# Patient Record
Sex: Female | Born: 1940 | Race: Black or African American | Hispanic: No | Smoking: Former smoker
Health system: Southern US, Community
[De-identification: ages and names within clinical notes are randomized; demographics above are authoritative.]

## PROBLEM LIST (undated history)

## (undated) DIAGNOSIS — K219 Gastro-esophageal reflux disease without esophagitis: Secondary | ICD-10-CM

## (undated) DIAGNOSIS — K5790 Diverticulosis of intestine, part unspecified, without perforation or abscess without bleeding: Secondary | ICD-10-CM

## (undated) DIAGNOSIS — Z8719 Personal history of other diseases of the digestive system: Secondary | ICD-10-CM

## (undated) DIAGNOSIS — K5792 Diverticulitis of intestine, part unspecified, without perforation or abscess without bleeding: Secondary | ICD-10-CM

## (undated) DIAGNOSIS — M810 Age-related osteoporosis without current pathological fracture: Secondary | ICD-10-CM

## (undated) DIAGNOSIS — M67919 Unspecified disorder of synovium and tendon, unspecified shoulder: Secondary | ICD-10-CM

## (undated) DIAGNOSIS — E7800 Pure hypercholesterolemia, unspecified: Secondary | ICD-10-CM

## (undated) DIAGNOSIS — E118 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: Secondary | ICD-10-CM

## (undated) DIAGNOSIS — I059 Rheumatic mitral valve disease, unspecified: Secondary | ICD-10-CM

## (undated) DIAGNOSIS — I10 Essential (primary) hypertension: Secondary | ICD-10-CM

## (undated) DIAGNOSIS — R011 Cardiac murmur, unspecified: Secondary | ICD-10-CM

## (undated) DIAGNOSIS — D649 Anemia, unspecified: Secondary | ICD-10-CM

## (undated) DIAGNOSIS — M1990 Unspecified osteoarthritis, unspecified site: Secondary | ICD-10-CM

## (undated) DIAGNOSIS — M719 Bursopathy, unspecified: Secondary | ICD-10-CM

## (undated) DIAGNOSIS — N200 Calculus of kidney: Secondary | ICD-10-CM

## (undated) DIAGNOSIS — E119 Type 2 diabetes mellitus without complications: Secondary | ICD-10-CM

## (undated) HISTORY — DX: Diverticulosis of intestine, part unspecified, without perforation or abscess without bleeding: K57.90

## (undated) HISTORY — DX: Rheumatic mitral valve disease, unspecified: I05.9

## (undated) HISTORY — DX: Personal history of other diseases of the digestive system: Z87.19

## (undated) HISTORY — DX: Unspecified disorder of synovium and tendon, unspecified shoulder: M67.919

## (undated) HISTORY — DX: Anemia, unspecified: D64.9

## (undated) HISTORY — DX: Pure hypercholesterolemia, unspecified: E78.00

## (undated) HISTORY — PX: ABDOMINAL HYSTERECTOMY: SHX81

## (undated) HISTORY — DX: 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: E11.8

## (undated) HISTORY — PX: PARTIAL HYSTERECTOMY: SHX80

## (undated) HISTORY — DX: Essential (primary) hypertension: I10

## (undated) HISTORY — DX: Gastro-esophageal reflux disease without esophagitis: K21.9

## (undated) HISTORY — DX: Age-related osteoporosis without current pathological fracture: M81.0

## (undated) HISTORY — DX: Bursopathy, unspecified: M71.9

## (undated) HISTORY — PX: OTHER SURGICAL HISTORY: SHX169

## (undated) HISTORY — PX: TOTAL VAGINAL HYSTERECTOMY: SHX2548

---

## 1968-02-19 HISTORY — PX: BREAST EXCISIONAL BIOPSY: SUR124

## 2004-01-18 ENCOUNTER — Ambulatory Visit: Payer: Self-pay | Admitting: Family Medicine

## 2005-01-24 ENCOUNTER — Ambulatory Visit: Payer: Self-pay | Admitting: Unknown Physician Specialty

## 2006-01-25 ENCOUNTER — Emergency Department: Payer: Self-pay | Admitting: General Practice

## 2006-01-28 ENCOUNTER — Ambulatory Visit: Payer: Self-pay | Admitting: Unknown Physician Specialty

## 2007-02-17 ENCOUNTER — Ambulatory Visit: Payer: Self-pay | Admitting: Unknown Physician Specialty

## 2008-02-18 ENCOUNTER — Ambulatory Visit: Payer: Self-pay | Admitting: Unknown Physician Specialty

## 2009-03-14 ENCOUNTER — Ambulatory Visit: Payer: Self-pay | Admitting: Unknown Physician Specialty

## 2010-03-20 ENCOUNTER — Ambulatory Visit: Payer: Self-pay | Admitting: Unknown Physician Specialty

## 2010-05-19 ENCOUNTER — Ambulatory Visit: Payer: Self-pay | Admitting: Family Medicine

## 2011-05-08 ENCOUNTER — Ambulatory Visit: Payer: Self-pay | Admitting: Unknown Physician Specialty

## 2011-11-22 ENCOUNTER — Emergency Department: Payer: Self-pay | Admitting: Emergency Medicine

## 2011-11-22 LAB — TROPONIN I: Troponin-I: <0.02 ng/mL

## 2011-11-22 LAB — BASIC METABOLIC PANEL
Anion Gap: 9 (ref 7–16)
BUN: 13 mg/dL (ref 7–18)
Calcium, Total: 9.6 mg/dL (ref 8.5–10.1)
Chloride: 103 mmol/L (ref 98–107)
Co2: 28 mmol/L (ref 21–32)
EGFR (African American): >60
Sodium: 140 mmol/L (ref 136–145)

## 2011-11-22 LAB — CBC
MCHC: 32.7 g/dL (ref 32.0–36.0)
MCV: 86 fL (ref 80–100)
Platelet: 337 10*3/uL (ref 150–440)
RBC: 4.02 10*6/uL (ref 3.80–5.20)
WBC: 9.9 10*3/uL (ref 3.6–11.0)

## 2012-06-09 ENCOUNTER — Ambulatory Visit: Payer: Self-pay | Admitting: Unknown Physician Specialty

## 2012-10-22 ENCOUNTER — Inpatient Hospital Stay: Payer: Self-pay | Admitting: Internal Medicine

## 2012-10-22 LAB — CBC
HCT: 28.7 % — ABNORMAL LOW (ref 35.0–47.0)
MCH: 28.1 pg (ref 26.0–34.0)
MCV: 86 fL (ref 80–100)
WBC: 18 10*3/uL — ABNORMAL HIGH (ref 3.6–11.0)

## 2012-10-22 LAB — APTT: Activated PTT: 28.5 secs (ref 23.6–35.9)

## 2012-10-22 LAB — COMPREHENSIVE METABOLIC PANEL
Albumin: 3.3 g/dL — ABNORMAL LOW (ref 3.4–5.0)
Anion Gap: 7 (ref 7–16)
Bilirubin,Total: 0.3 mg/dL (ref 0.2–1.0)
Calcium, Total: 9.3 mg/dL (ref 8.5–10.1)
Chloride: 102 mmol/L (ref 98–107)
Creatinine: 1.3 mg/dL (ref 0.60–1.30)
EGFR (African American): 48 — ABNORMAL LOW
Potassium: 3.8 mmol/L (ref 3.5–5.1)
SGPT (ALT): 25 U/L (ref 12–78)

## 2012-10-22 LAB — PROTIME-INR: INR: 1.2

## 2012-10-23 LAB — CBC WITH DIFFERENTIAL/PLATELET
Basophil #: 0.1 10*3/uL (ref 0.0–0.1)
Basophil %: 0.6 %
Eosinophil #: 0.1 10*3/uL (ref 0.0–0.7)
HCT: 22.3 % — ABNORMAL LOW (ref 35.0–47.0)
HGB: 7.5 g/dL — ABNORMAL LOW (ref 12.0–16.0)
Lymphocyte #: 2.4 10*3/uL (ref 1.0–3.6)
Lymphocyte %: 23.2 %
MCH: 28.2 pg (ref 26.0–34.0)
MCV: 84 fL (ref 80–100)
Monocyte #: 1 x10 3/mm — ABNORMAL HIGH (ref 0.2–0.9)
Monocyte %: 9.1 %
Neutrophil #: 6.9 10*3/uL — ABNORMAL HIGH (ref 1.4–6.5)
RBC: 2.65 10*6/uL — ABNORMAL LOW (ref 3.80–5.20)
RDW: 14.7 % — ABNORMAL HIGH (ref 11.5–14.5)
WBC: 10.5 10*3/uL (ref 3.6–11.0)

## 2012-10-23 LAB — BASIC METABOLIC PANEL
Anion Gap: 6 — ABNORMAL LOW (ref 7–16)
BUN: 15 mg/dL (ref 7–18)
Calcium, Total: 8.3 mg/dL — ABNORMAL LOW (ref 8.5–10.1)
Osmolality: 284 (ref 275–301)
Potassium: 3.4 mmol/L — ABNORMAL LOW (ref 3.5–5.1)
Sodium: 142 mmol/L (ref 136–145)

## 2012-10-24 LAB — CBC WITH DIFFERENTIAL/PLATELET
Basophil #: 0.1 10*3/uL (ref 0.0–0.1)
Eosinophil #: 0.2 10*3/uL (ref 0.0–0.7)
Eosinophil %: 1.7 %
HGB: 8.7 g/dL — ABNORMAL LOW (ref 12.0–16.0)
Lymphocyte #: 2.6 10*3/uL (ref 1.0–3.6)
Lymphocyte %: 25.7 %
MCH: 28.7 pg (ref 26.0–34.0)
MCHC: 33.9 g/dL (ref 32.0–36.0)
Monocyte %: 10.5 %
RBC: 3.02 10*6/uL — ABNORMAL LOW (ref 3.80–5.20)
RDW: 15 % — ABNORMAL HIGH (ref 11.5–14.5)
WBC: 10.1 10*3/uL (ref 3.6–11.0)

## 2012-10-24 LAB — BASIC METABOLIC PANEL
Anion Gap: 6 — ABNORMAL LOW (ref 7–16)
BUN: 8 mg/dL (ref 7–18)
Calcium, Total: 8.2 mg/dL — ABNORMAL LOW (ref 8.5–10.1)
Chloride: 112 mmol/L — ABNORMAL HIGH (ref 98–107)
Co2: 26 mmol/L (ref 21–32)
Creatinine: 0.86 mg/dL (ref 0.60–1.30)
EGFR (African American): >60
EGFR (Non-African Amer.): >60
Glucose: 116 mg/dL — ABNORMAL HIGH (ref 65–99)
Osmolality: 286 (ref 275–301)
Sodium: 144 mmol/L (ref 136–145)

## 2012-10-25 LAB — POTASSIUM: Potassium: 3.6 mmol/L (ref 3.5–5.1)

## 2013-04-22 ENCOUNTER — Emergency Department: Payer: Self-pay | Admitting: Emergency Medicine

## 2013-04-22 LAB — COMPREHENSIVE METABOLIC PANEL
ALBUMIN: 3.2 g/dL — AB (ref 3.4–5.0)
ANION GAP: 6 — AB (ref 7–16)
Alkaline Phosphatase: 91 U/L
BUN: 22 mg/dL — ABNORMAL HIGH (ref 7–18)
Bilirubin,Total: 0.2 mg/dL (ref 0.2–1.0)
CO2: 27 mmol/L (ref 21–32)
CREATININE: 1.09 mg/dL (ref 0.60–1.30)
Calcium, Total: 9.4 mg/dL (ref 8.5–10.1)
Chloride: 100 mmol/L (ref 98–107)
EGFR (African American): 59 — ABNORMAL LOW
EGFR (Non-African Amer.): 51 — ABNORMAL LOW
GLUCOSE: 128 mg/dL — AB (ref 65–99)
Osmolality: 271 (ref 275–301)
Potassium: 4 mmol/L (ref 3.5–5.1)
SGOT(AST): 24 U/L (ref 15–37)
SGPT (ALT): 21 U/L (ref 12–78)
Sodium: 133 mmol/L — ABNORMAL LOW (ref 136–145)
Total Protein: 8 g/dL (ref 6.4–8.2)

## 2013-04-22 LAB — URINALYSIS, COMPLETE
BILIRUBIN, UR: NEGATIVE
BLOOD: NEGATIVE
Bacteria: NONE SEEN
GLUCOSE, UR: NEGATIVE mg/dL (ref 0–75)
Hyaline Cast: 31
Ketone: NEGATIVE
NITRITE: NEGATIVE
PH: 6 (ref 4.5–8.0)
Protein: 100
RBC, UR: NONE SEEN /HPF (ref 0–5)
Specific Gravity: 1.019 (ref 1.003–1.030)
Squamous Epithelial: 4
WBC UR: 338 /HPF (ref 0–5)

## 2013-04-22 LAB — TROPONIN I: Troponin-I: <0.02 ng/mL

## 2013-04-22 LAB — CBC
HCT: 35.3 % (ref 35.0–47.0)
HGB: 11.6 g/dL — ABNORMAL LOW (ref 12.0–16.0)
MCH: 27.4 pg (ref 26.0–34.0)
MCHC: 33 g/dL (ref 32.0–36.0)
MCV: 83 fL (ref 80–100)
PLATELETS: 359 10*3/uL (ref 150–440)
RBC: 4.25 10*6/uL (ref 3.80–5.20)
RDW: 15.8 % — ABNORMAL HIGH (ref 11.5–14.5)
WBC: 10.3 10*3/uL (ref 3.6–11.0)

## 2013-04-22 LAB — LIPASE, BLOOD: LIPASE: 521 U/L — AB (ref 73–393)

## 2013-04-24 LAB — URINE CULTURE

## 2013-05-07 ENCOUNTER — Ambulatory Visit: Payer: Self-pay | Admitting: Urology

## 2013-05-20 ENCOUNTER — Ambulatory Visit: Payer: Self-pay | Admitting: Urology

## 2013-05-27 ENCOUNTER — Ambulatory Visit: Payer: Self-pay | Admitting: Urology

## 2013-06-10 ENCOUNTER — Ambulatory Visit: Payer: Self-pay | Admitting: Internal Medicine

## 2013-06-22 ENCOUNTER — Ambulatory Visit: Payer: Self-pay | Admitting: Internal Medicine

## 2014-03-08 DIAGNOSIS — H4011X1 Primary open-angle glaucoma, mild stage: Secondary | ICD-10-CM | POA: Diagnosis not present

## 2014-05-02 DIAGNOSIS — E119 Type 2 diabetes mellitus without complications: Secondary | ICD-10-CM | POA: Diagnosis not present

## 2014-05-02 DIAGNOSIS — I10 Essential (primary) hypertension: Secondary | ICD-10-CM | POA: Diagnosis not present

## 2014-05-09 DIAGNOSIS — R42 Dizziness and giddiness: Secondary | ICD-10-CM | POA: Diagnosis not present

## 2014-05-09 DIAGNOSIS — I10 Essential (primary) hypertension: Secondary | ICD-10-CM | POA: Diagnosis not present

## 2014-05-09 DIAGNOSIS — D649 Anemia, unspecified: Secondary | ICD-10-CM | POA: Diagnosis not present

## 2014-05-09 DIAGNOSIS — E1129 Type 2 diabetes mellitus with other diabetic kidney complication: Secondary | ICD-10-CM | POA: Diagnosis not present

## 2014-05-15 ENCOUNTER — Emergency Department: Payer: Self-pay | Admitting: Internal Medicine

## 2014-05-15 DIAGNOSIS — F419 Anxiety disorder, unspecified: Secondary | ICD-10-CM | POA: Diagnosis not present

## 2014-05-15 DIAGNOSIS — R109 Unspecified abdominal pain: Secondary | ICD-10-CM | POA: Diagnosis not present

## 2014-05-15 DIAGNOSIS — K5730 Diverticulosis of large intestine without perforation or abscess without bleeding: Secondary | ICD-10-CM | POA: Diagnosis not present

## 2014-05-15 LAB — COMPREHENSIVE METABOLIC PANEL
ALBUMIN: 3.9 g/dL
ALK PHOS: 79 U/L
ALT: 17 U/L
ANION GAP: 9 (ref 7–16)
BILIRUBIN TOTAL: 0.4 mg/dL
BUN: 15 mg/dL
CHLORIDE: 95 mmol/L — AB
CREATININE: 0.84 mg/dL
Calcium, Total: 9.2 mg/dL
Co2: 29 mmol/L
EGFR (African American): >60
EGFR (Non-African Amer.): >60
GLUCOSE: 89 mg/dL
POTASSIUM: 3.8 mmol/L
SGOT(AST): 22 U/L
Sodium: 133 mmol/L — ABNORMAL LOW
TOTAL PROTEIN: 8.5 g/dL — AB

## 2014-05-15 LAB — CBC
HCT: 34.2 % — ABNORMAL LOW (ref 35.0–47.0)
HGB: 10.9 g/dL — ABNORMAL LOW (ref 12.0–16.0)
MCH: 27.5 pg (ref 26.0–34.0)
MCHC: 31.8 g/dL — ABNORMAL LOW (ref 32.0–36.0)
MCV: 87 fL (ref 80–100)
Platelet: 358 10*3/uL (ref 150–440)
RBC: 3.96 10*6/uL (ref 3.80–5.20)
RDW: 15.8 % — AB (ref 11.5–14.5)
WBC: 13.9 10*3/uL — ABNORMAL HIGH (ref 3.6–11.0)

## 2014-05-15 LAB — URINALYSIS, COMPLETE
BILIRUBIN, UR: NEGATIVE
Bacteria: NONE SEEN
Blood: NEGATIVE
Glucose,UR: NEGATIVE mg/dL (ref 0–75)
KETONE: NEGATIVE
Leukocyte Esterase: NEGATIVE
NITRITE: NEGATIVE
Ph: 6 (ref 4.5–8.0)
SPECIFIC GRAVITY: 1.012 (ref 1.003–1.030)
Squamous Epithelial: 1

## 2014-05-18 DIAGNOSIS — R42 Dizziness and giddiness: Secondary | ICD-10-CM | POA: Diagnosis not present

## 2014-05-18 DIAGNOSIS — R109 Unspecified abdominal pain: Secondary | ICD-10-CM | POA: Diagnosis not present

## 2014-06-07 DIAGNOSIS — E1129 Type 2 diabetes mellitus with other diabetic kidney complication: Secondary | ICD-10-CM | POA: Diagnosis not present

## 2014-06-07 DIAGNOSIS — R809 Proteinuria, unspecified: Secondary | ICD-10-CM | POA: Diagnosis not present

## 2014-06-07 DIAGNOSIS — Z0000 Encounter for general adult medical examination without abnormal findings: Secondary | ICD-10-CM | POA: Diagnosis not present

## 2014-06-07 DIAGNOSIS — E538 Deficiency of other specified B group vitamins: Secondary | ICD-10-CM | POA: Diagnosis not present

## 2014-06-07 DIAGNOSIS — I10 Essential (primary) hypertension: Secondary | ICD-10-CM | POA: Diagnosis not present

## 2014-06-07 DIAGNOSIS — Z23 Encounter for immunization: Secondary | ICD-10-CM | POA: Diagnosis not present

## 2014-06-10 NOTE — Consult Note (Signed)
Brief Consult Note: Diagnosis: GI bleed, anemia.   Consult note dictated.   Discussed with Attending MD.   Comments: Patient seen and examined. She presents with two episodes of blood per rectum yesterday, described at bright blood without stool, and was a "significant" amount. No abdominal or rectal pain. no diarrhea or constipation. Her hgb at admission was 9.4 (her baseline is beetween 11-12). Since admission, hgb dropped overnight to 7.1. Rececked and it was 7.5. Orders have been written to transfuse 1 unit PRBCs. Patient denies any further rectal bleeding today. No upper GI complaints, no nausea/vomiting/dysphagia/dyspepsia/abdominal pain.  Last colonoscopy was in 2005 notable for diverticulosis.  Currently on a PPI drip. VVS. No further bleeding.  Continue PPI, serial Hgbs, transfuse as necessary. Agree with CL diet, do not advance for now. Will need a colonoscopy prior to discharge, patient is agreeable. will discuss with Dr Rayann Heman regarding scheduling. Full consult being dictated. will follow.  Electronic Signatures: Loren Racer M (PA-C)  (Signed 05-Sep-14 12:40)  Authored: Brief Consult Note   Last Updated: 05-Sep-14 12:40 by Sherald Barge (PA-C)

## 2014-06-10 NOTE — Consult Note (Signed)
PATIENT NAME:  Jordan Small, Jordan Small MR#:  [id number] DATE OF BIRTH:  [birth year]  DATE OF CONSULTATION:  10/23/2012  REFERRING PHYSICIAN: Dr. Bridgett Larsson  CONSULTING PHYSICIAN:  Corky Sox. Zettie Pho, PA-C and Arther Dames, MD  REASON FOR CONSULTATION: GI bleed and anemia.   HISTORY OF PRESENT ILLNESS: This is a pleasant 74 year old female, who presented to the ER yesterday after 2 episodes of blood per rectum. She states that she woke up and had 2 separate episodes where she noticed a significant amount of bright red blood coming from her bottom. She states there was no accompanying stool with this and there did appear to be quite an amount of blood. There was no associated abdominal pain or rectal pain. She denies any recent diarrhea, constipation or melena. There is no upper GI symptoms to include nausea, vomiting, dysphagia, indigestion or upper stomach pains. There is no fever or chills. There is no chest pain or shortness of breath. The patient states that her appetite has been intact and there have been no unintentional weight changes. Of note, her last colonoscopy was in 2005 notable for diverticulosis and she was told she would not need a repeat until 2015. There is no lightheadedness or dizziness. Upon initial presentation to the ER, her hemoglobin was 9.4 with her baseline hemoglobin usually being between 11 and 12. Since being admitted, her hemoglobin did trend down to 7.1 and then rechecked was 7.5. MCV currently is 84. Orders have been written for her to be transfused 1 unit of packed red blood cells. She is currently on a PPI drip.   PAST MEDICAL HISTORY: Diabetes mellitus, dyslipidemia, hypertension and diverticulosis.   PAST SURGICAL HISTORY: Hysterectomy.   SOCIAL HISTORY: Remote history of tobacco use, but no current alcohol, tobacco or illicit drug use.   FAMILY HISTORY: No known family history of GI malignancy, colon polyps or IBD.   REVIEW OF SYSTEMS: 10 system review of systems was obtained on  the patient. Pertinent positives are mentioned above and otherwise negative.   OBJECTIVE:  VITAL SIGNS: Blood pressure 152/72, heart rate 65, respirations 18, temperature 98.1, bedside pulse oximetry is 100% on room air.  GENERAL: This is a pleasant 74 year old female resting quietly and comfortably in the exam room in no acute distress. Alert and oriented x 3.  HEAD: Atraumatic, normocephalic.  NECK: Supple. No lymphadenopathy noted.  HEENT: Sclerae anicteric. Mucous membranes moist.  LUNGS: Respirations are even and unlabored. Clear to auscultation bilateral anterior lung fields.  HEART: Regular rate and rhythm. S1, S2 noted.  ABDOMEN: Soft, nontender, nondistended. Normoactive bowel sounds noted in all 4 quadrants. No masses palpated. No guarding or rebound.  RECTAL: Deferred.  PSYCHIATRIC: Appropriate mood and affect.   LABORATORY DATA: White blood cells 10.5, hemoglobin 7.5, trending down from 9.4, MCV 84, hematocrit 22.3, platelets 253. Sodium 142, potassium 3.4, BUN 15, creatinine 0.93, glucose 93. LFTs are within normal limits. INR 1.2. PT 15.4.   ASSESSMENT:  1.  Gastrointestinal bleed described as a significant amount of bright red blood coming from her bottom.  2.  Anemia, likely secondary to gastrointestinal blood loss.  3.  History of diverticulosis noted on colonoscopy in 2005.   PLAN: I have discussed this patient's case in detail with Dr. Kathrin Ruddy, who is involved in the development of the patient's plan of care. At this time, because the patient has been experiencing rectal bleeding that is now starting to affect her blood counts, we do agree with checking serial hemoglobins and continuing  a Protonix drip. Because her hemoglobin has dropped below 8, we do agree with her being transfused 2 units of packed red blood cells as well. Her last colonoscopy was 9 years ago and there was diverticula and therefore, this could certainly be a diverticular bleed, but we do recommend the  patient proceed with a colonoscopy prior to her discharge to evaluate the source. Alternatives, risks and benefits of the colonoscopy were discussed in detail with the patient, who verbalized understanding and is in agreement to proceed. We recommend that she be maintained on a clear liquid diet for now and I will discuss with Dr. Thurmond Butts regarding scheduling this procedure. This was explained to the patient and all questions were answered.   Thank you so much for this consultation and for allowing [place] to participate in the patient's plan of care. ____________________________ Corky Sox. Hurley Sobel, PA-C kme:[id number] D: 10/23/2012 13:28:25 ET T: 10/23/2012 13:42:12 ET JOB#: [id number]  cc: Corky Sox. Aayat Hajjar, PA-C, <Dictator> [place] PA ELECTRONICALLY SIGNED 10/26/2012 11:31

## 2014-06-10 NOTE — Discharge Summary (Signed)
PATIENT NAME:  Jordan Small, Jordan Small MR#:  [id number] DATE OF BIRTH:  01-27-1941  DATE OF ADMISSION:  10/22/2012 DATE OF DISCHARGE:  10/25/2012  PRIMARY CARE PHYSICIAN:  Dr. Candiss Norse at the [facility].   FINAL DIAGNOSES 1.  Acute blood loss anemia, lower gastrointestinal bleed, likely diverticular in nature.  2.  Hypomagnesemia.  3.  Hypokalemia.  4.  Leukocytosis.  5.  Hypertension.  6.  Diabetes.   Stop aspirin any other anti-inflammatories, and also stop hydrochlorothiazide.   MEDICATIONS ON DISCHARGE: Include glipizide 10 mg twice a day, metformin 1000 mg twice a day, lisinopril 40 mg daily, Lipitor 80 mg at bedtime, ferrous sulfate 325 mg twice a day, alendronate 70 mg daily on Friday, calcium carbonate 600 mg twice a day, vitamin C 1000 mg twice a day, Januvia 100 mg daily, magnesium oxide 400 mg daily.   DIET: Low sodium, carbohydrate-controlled diet, regular consistency.   ACTIVITY: As tolerated.   FOLLOWUP: With Dr. Candiss Norse in 1 to 2 weeks at the [facility].   HOSPITAL COURSE: The patient was admitted October 22, 2012, discharged October 25, 2012. The patient came in with bloody stool, was admitted with GI bleed and anemia. The patient was initially kept n.p.o., started on IV fluids and Protonix drip. GI consultation was obtained by Dr. Arther Dames and he performed a colonoscopy on October 24, 2012, that showed external hemorrhoids, diverticulosis, otherwise normal exam.   LABORATORY AND RADIOLOGICAL DATA [place]: Included INR 1.2. Glucose 274, BUN 21, creatinine 1.3, sodium 136, potassium 3.8, chloride 102, CO2 27, calcium 9.3. Liver function tests normal range. White blood cell count 18.0, H and H 9.4 and 28.7, platelet count of 308. EKG showed normal sinus rhythm, nonspecific ST-T wave changes. Hemoglobin A1c 7.1. Magnesium low at 2.1. A repeat potassium went down to 3.4 on September 6th, creatinine 0.86. Hemoglobin 8.7 on the 6th. Magnesium on the morning  of the 7th was 1.0, that was replaced and rechecked again up to 2.1. Hemoglobin upon discharge 8.4.   HOSPITAL COURSE PER PROBLEM LIST 1.  For the patient's acute blood loss anemia, this likely was lower GI bleed, diverticular in nature. The patient was transfused 1 unit of packed red blood cells. During the hospital course, hemoglobin remained stable with no further bleeding upon discharge. Her aspirin was stopped. She was advised not to take aspirin or any anti-inflammatories at this point. The patient had a colonoscopy that showed no active bleeding. The patient will continue back on her iron that she takes at home as outpatient. Follow up with Dr. Candiss Norse in 1 to 2 weeks for repeat hemoglobin.  2.  For the patient's hypomagnesemia, probably because the patient was not eating much during the hospitalization. I did replace magnesium 4 grams on the day of discharge IV and rechecked up to 2.1, in the normal range. I will give her oral magnesium upon going home, stop the hydrochlorothiazide.  3.  Hypokalemia. This was replaced during the hospital course. I believe stopping the hydrochlorothiazide will help out with this.  4.  Leukocytosis secondary to stress reaction with the GI bleed.  5.  Hypertension. Hydrochlorothiazide was held. Continue the lisinopril and that should be good enough for the blood pressure.  6.  Diabetes. Can continue the glipizide, metformin and Januvia, restart as outpatient.   TIME SPENT ON DISCHARGE: 35 minutes.   The patient's creatinine upon discharge 0.86.   ____________________________ Tana Conch. Leslye Peer, MD rjw:cs D: 10/25/2012 12:51:00 ET T: 10/25/2012  14:39:40 ET JOB#: [id number]  cc: Tana Conch. Leslye Peer, MD, <Dictator> Dr. Candiss Norse at the [place] SIGNED 10/30/2012 16:14

## 2014-06-10 NOTE — Consult Note (Signed)
Details:   - colonoscopy with no blood,  only sigmoid diverticulosis.  Normal T.I.    Likely resovled diverticular bleed.      [place] with d/c home tomorrow if no further bleeding and Hgb stable.   Electronic Signatures: Arther Dames (MD)  (Signed 06-Sep-14 16:56)  Authored: Details   Last Updated: 06-Sep-14 16:56 by Arther Dames (MD)

## 2014-06-10 NOTE — Consult Note (Signed)
Details:   - I have seen morphine Bennye Alm and agree with [place] Earle's a/p.    LGIB with significant drop in Hgb.  No hemodynamic changes.  No stool since last night so bleeding likely resovled.  Likely divertiular but last colon 9 years ago.   Will plan for colonoscopy tomorrow.   Golytely prep this evening and tomorrow at 7 am.  (1/2 tonight, 1/2 in am).   Please obtain tagged RBC scan if evidence of active bleeding before tomorow.   Electronic Signatures: Arther Dames (MD)  (Signed 05-Sep-14 14:42)  Authored: Details   Last Updated: 05-Sep-14 14:42 by Arther Dames (MD)

## 2014-06-10 NOTE — H&P (Signed)
PATIENT NAME:  Jordan Small, KINSELLA MR#:  [id number] DATE OF BIRTH:  1940/07/26  DATE OF ADMISSION:  10/22/2012  PRIMARY CARE PHYSICIAN:  Dr. Kem Kays  CHIEF COMPLAINT: Bloody stool today.   HISTORY OF PRESENT ILLNESS: A 74 year old African American female with a history of hypertension, diabetes, diverticulosis presented in the ED with bloody stool today. The patient is alert, awake, oriented, in no acute distress. The patient said she had a bloody stool twice in the morning, and she was scared and she came to the ED for further evaluation. The patient had 1 bloody stool in the ED. The patient feels dizzy, weak. Her hemoglobin is 9.4 today, but was 11.3 last October. The patient denies any chest pain, palpitation. No orthopnea or nocturnal dyspnea. No leg edema.  The patient denies any bloody urine. No other bleeding or bruise.  PAST MEDICAL HISTORY: Hypertension, diabetes, diverticulosis, hyperlipidemia.   SOCIAL HISTORY: The patient quit smoking a long time ago. No alcohol drinking or illicit drugs.   PAST SURGICAL HISTORY: Hysterectomy.   FAMILY HISTORY: Diabetes in her mother.   REVIEW OF SYSTEMS:  CONSTITUTIONAL: The patient denies any fever or chills. No headache but has dizziness, generalized weakness.  EYES: No double vision or blurred vision.  EARS, NOSE, THROAT: No postnasal drip, slurred speech or dysphagia.  CARDIOVASCULAR: No chest pain, palpitation, orthopnea or nocturnal dyspnea. No leg edema.  PULMONARY: No cough, sputum, shortness of breath or hemoptysis.  GASTROINTESTINAL: No abdominal pain but has nausea. No vomiting. No diarrhea, but had bloody stool.  GENITOURINARY:  No dysuria, hematuria or incontinence.  SKIN: No rash or jaundice.  NEUROLOGY: No syncope, loss of consciousness or seizure.  HEMATOLOGY: No easy bruising but has bloody stools, bleeding.  ENDOCRINOLOGY: No polyuria, polydipsia, heat or cold intolerance.   PHYSICAL EXAMINATION: VITAL SIGNS:  Temperature 98.6, blood pressure 160/83, pulse 81, respirations 18, O2 saturation 96% on room air.  GENERAL: The patient is alert, awake, oriented, in no acute distress.  HEENT: Pupils round, equal and reactive to light and accommodation. Moist oral mucosa. Clear oropharynx.  NECK: Supple. No JVD or carotid bruit. No lymphadenopathy. No thyromegaly.  CARDIOVASCULAR: S1, S2. Regular rate and rhythm. No murmurs or gallops. PULMONARY: Bilateral air entry. No wheezing or rales. No use of accessory muscles to breathe.  ABDOMEN: Soft. No distention. No tenderness. No organomegaly. Bowel sounds present.  EXTREMITIES: No edema, clubbing or cyanosis. No calf tenderness. Strong bilateral pedal pulses.   SKIN: No rash or jaundice.  NEUROLOGY: A and O x 3. No focal deficit. Power 5/5. Sensation intact.   DIAGNOSTICS AND LABORATORY DATA: Glucose 274, BUN 21, creatinine 1.3, sodium 136, potassium 3.8, chloride 102, bicarb 27. WBC 18, hemoglobin 9.4 and hematocrit 28.7, platelets 308. INR 1.2, PTT 28.5. EKG showed normal sinus rhythm at 79 bpm.   IMPRESSIONS: 1.  Gastrointestinal bleeding.  2.  Anemia.  3.  Leukocytosis, possibly due to reaction.  4.  Hypertension.  5.  Diabetes.  6.  History of diverticulosis.   PLAN OF TREATMENT: 1.  The patient will be admitted to medical floor. We will keep n.p.o. except meds and start Protonix drip, IV fluid, normal saline support and follow up hemoglobin and GI consult, also hold aspirin.  2.  For hypertension, we will continue lisinopril, hold HCTZ, give hydralazine IV p.r.n.  3.  For diabetes, we will hold p.o. diabetes medications and start a sliding scale. Check hemoglobin A1c. Discussed the patient's condition, plan of treatment with the  patient. The patient prefers full code at this time.   TIME SPENT: About 55 minutes.    ____________________________ Demetrios Loll, MD qc:dmm D: 10/22/2012 18:10:20 ET T: 10/22/2012 19:04:27 ET JOB#: [id number]  cc: Demetrios Loll, MD, <Dictator> Demetrios Loll MD ELECTRONICALLY SIGNED 10/22/2012 20:22

## 2014-06-15 ENCOUNTER — Ambulatory Visit: Admit: 2014-06-15 | Disposition: A | Discharge status: Home / Self Care | Payer: Self-pay | Admitting: Internal Medicine

## 2014-06-15 DIAGNOSIS — Z1231 Encounter for screening mammogram for malignant neoplasm of breast: Secondary | ICD-10-CM | POA: Diagnosis not present

## 2014-07-14 DIAGNOSIS — R05 Cough: Secondary | ICD-10-CM | POA: Diagnosis not present

## 2014-09-05 DIAGNOSIS — H4011X1 Primary open-angle glaucoma, mild stage: Secondary | ICD-10-CM | POA: Diagnosis not present

## 2014-09-07 DIAGNOSIS — E119 Type 2 diabetes mellitus without complications: Secondary | ICD-10-CM | POA: Diagnosis not present

## 2014-09-07 DIAGNOSIS — I10 Essential (primary) hypertension: Secondary | ICD-10-CM | POA: Diagnosis not present

## 2014-09-07 DIAGNOSIS — E538 Deficiency of other specified B group vitamins: Secondary | ICD-10-CM | POA: Diagnosis not present

## 2014-09-07 DIAGNOSIS — M8580 Other specified disorders of bone density and structure, unspecified site: Secondary | ICD-10-CM | POA: Diagnosis not present

## 2014-11-25 DIAGNOSIS — Z23 Encounter for immunization: Secondary | ICD-10-CM | POA: Diagnosis not present

## 2014-12-01 DIAGNOSIS — E119 Type 2 diabetes mellitus without complications: Secondary | ICD-10-CM | POA: Diagnosis not present

## 2014-12-08 DIAGNOSIS — E1129 Type 2 diabetes mellitus with other diabetic kidney complication: Secondary | ICD-10-CM | POA: Diagnosis not present

## 2014-12-08 DIAGNOSIS — R809 Proteinuria, unspecified: Secondary | ICD-10-CM | POA: Diagnosis not present

## 2014-12-08 DIAGNOSIS — E7800 Pure hypercholesterolemia, unspecified: Secondary | ICD-10-CM | POA: Diagnosis not present

## 2014-12-08 DIAGNOSIS — I10 Essential (primary) hypertension: Secondary | ICD-10-CM | POA: Diagnosis not present

## 2015-02-19 HISTORY — PX: COLPOSCOPY: SHX161

## 2015-03-06 DIAGNOSIS — M8580 Other specified disorders of bone density and structure, unspecified site: Secondary | ICD-10-CM | POA: Diagnosis not present

## 2015-03-06 DIAGNOSIS — I10 Essential (primary) hypertension: Secondary | ICD-10-CM | POA: Diagnosis not present

## 2015-03-06 DIAGNOSIS — E7800 Pure hypercholesterolemia, unspecified: Secondary | ICD-10-CM | POA: Diagnosis not present

## 2015-03-06 DIAGNOSIS — E1129 Type 2 diabetes mellitus with other diabetic kidney complication: Secondary | ICD-10-CM | POA: Diagnosis not present

## 2015-03-06 DIAGNOSIS — R809 Proteinuria, unspecified: Secondary | ICD-10-CM | POA: Diagnosis not present

## 2015-03-06 DIAGNOSIS — Z1239 Encounter for other screening for malignant neoplasm of breast: Secondary | ICD-10-CM | POA: Diagnosis not present

## 2015-03-07 ENCOUNTER — Other Ambulatory Visit: Payer: Self-pay | Admitting: Internal Medicine

## 2015-03-07 DIAGNOSIS — Z1239 Encounter for other screening for malignant neoplasm of breast: Secondary | ICD-10-CM

## 2015-03-10 DIAGNOSIS — H401112 Primary open-angle glaucoma, right eye, moderate stage: Secondary | ICD-10-CM | POA: Diagnosis not present

## 2015-03-17 DIAGNOSIS — E119 Type 2 diabetes mellitus without complications: Secondary | ICD-10-CM | POA: Diagnosis not present

## 2015-04-07 DIAGNOSIS — R809 Proteinuria, unspecified: Secondary | ICD-10-CM | POA: Diagnosis not present

## 2015-04-07 DIAGNOSIS — R1032 Left lower quadrant pain: Secondary | ICD-10-CM | POA: Diagnosis not present

## 2015-04-07 DIAGNOSIS — I10 Essential (primary) hypertension: Secondary | ICD-10-CM | POA: Diagnosis not present

## 2015-04-07 DIAGNOSIS — E1129 Type 2 diabetes mellitus with other diabetic kidney complication: Secondary | ICD-10-CM | POA: Diagnosis not present

## 2015-06-01 DIAGNOSIS — R809 Proteinuria, unspecified: Secondary | ICD-10-CM | POA: Diagnosis not present

## 2015-06-01 DIAGNOSIS — E1129 Type 2 diabetes mellitus with other diabetic kidney complication: Secondary | ICD-10-CM | POA: Diagnosis not present

## 2015-06-01 DIAGNOSIS — I10 Essential (primary) hypertension: Secondary | ICD-10-CM | POA: Diagnosis not present

## 2015-06-01 DIAGNOSIS — E7800 Pure hypercholesterolemia, unspecified: Secondary | ICD-10-CM | POA: Diagnosis not present

## 2015-06-08 DIAGNOSIS — R809 Proteinuria, unspecified: Secondary | ICD-10-CM | POA: Diagnosis not present

## 2015-06-08 DIAGNOSIS — M8580 Other specified disorders of bone density and structure, unspecified site: Secondary | ICD-10-CM | POA: Diagnosis not present

## 2015-06-08 DIAGNOSIS — Z124 Encounter for screening for malignant neoplasm of cervix: Secondary | ICD-10-CM | POA: Diagnosis not present

## 2015-06-08 DIAGNOSIS — R1032 Left lower quadrant pain: Secondary | ICD-10-CM | POA: Diagnosis not present

## 2015-06-08 DIAGNOSIS — Z1239 Encounter for other screening for malignant neoplasm of breast: Secondary | ICD-10-CM | POA: Diagnosis not present

## 2015-06-08 DIAGNOSIS — E1129 Type 2 diabetes mellitus with other diabetic kidney complication: Secondary | ICD-10-CM | POA: Diagnosis not present

## 2015-06-08 DIAGNOSIS — R87612 Low grade squamous intraepithelial lesion on cytologic smear of cervix (LGSIL): Secondary | ICD-10-CM | POA: Diagnosis not present

## 2015-06-08 DIAGNOSIS — Z23 Encounter for immunization: Secondary | ICD-10-CM | POA: Diagnosis not present

## 2015-06-08 DIAGNOSIS — Z0000 Encounter for general adult medical examination without abnormal findings: Secondary | ICD-10-CM | POA: Diagnosis not present

## 2015-06-09 ENCOUNTER — Other Ambulatory Visit: Payer: Self-pay | Admitting: Internal Medicine

## 2015-06-09 DIAGNOSIS — R1032 Left lower quadrant pain: Secondary | ICD-10-CM

## 2015-06-16 ENCOUNTER — Other Ambulatory Visit: Payer: Self-pay | Admitting: Internal Medicine

## 2015-06-16 ENCOUNTER — Ambulatory Visit
Admission: RE | Admit: 2015-06-16 | Discharge: 2015-06-16 | Disposition: A | Discharge status: Home / Self Care | Payer: Commercial Managed Care - HMO | Source: Ambulatory Visit | Attending: Internal Medicine | Admitting: Internal Medicine

## 2015-06-16 DIAGNOSIS — Z1231 Encounter for screening mammogram for malignant neoplasm of breast: Secondary | ICD-10-CM | POA: Diagnosis not present

## 2015-06-16 DIAGNOSIS — Z1239 Encounter for other screening for malignant neoplasm of breast: Secondary | ICD-10-CM

## 2015-06-16 DIAGNOSIS — M8580 Other specified disorders of bone density and structure, unspecified site: Secondary | ICD-10-CM | POA: Diagnosis not present

## 2015-06-19 ENCOUNTER — Ambulatory Visit
Admission: RE | Admit: 2015-06-19 | Discharge: 2015-06-19 | Disposition: A | Discharge status: Home / Self Care | Payer: Commercial Managed Care - HMO | Source: Ambulatory Visit | Attending: Internal Medicine | Admitting: Internal Medicine

## 2015-06-19 DIAGNOSIS — R1032 Left lower quadrant pain: Secondary | ICD-10-CM | POA: Diagnosis not present

## 2015-06-19 DIAGNOSIS — K5730 Diverticulosis of large intestine without perforation or abscess without bleeding: Secondary | ICD-10-CM | POA: Insufficient documentation

## 2015-06-19 DIAGNOSIS — K429 Umbilical hernia without obstruction or gangrene: Secondary | ICD-10-CM | POA: Diagnosis not present

## 2015-06-19 HISTORY — DX: Type 2 diabetes mellitus without complications: E11.9

## 2015-06-19 HISTORY — DX: Essential (primary) hypertension: I10

## 2015-06-19 MED ORDER — IOPAMIDOL (ISOVUE-300) INJECTION 61%
85.0000 mL | Freq: Once | INTRAVENOUS | Status: AC | PRN
  Administered 2015-06-19: 85 mL via INTRAVENOUS

## 2015-06-27 DIAGNOSIS — N761 Subacute and chronic vaginitis: Secondary | ICD-10-CM | POA: Diagnosis not present

## 2015-06-27 DIAGNOSIS — R87622 Low grade squamous intraepithelial lesion on cytologic smear of vagina (LGSIL): Secondary | ICD-10-CM | POA: Diagnosis not present

## 2015-08-31 DIAGNOSIS — E1129 Type 2 diabetes mellitus with other diabetic kidney complication: Secondary | ICD-10-CM | POA: Diagnosis not present

## 2015-08-31 DIAGNOSIS — R809 Proteinuria, unspecified: Secondary | ICD-10-CM | POA: Diagnosis not present

## 2015-09-12 DIAGNOSIS — M8589 Other specified disorders of bone density and structure, multiple sites: Secondary | ICD-10-CM | POA: Diagnosis not present

## 2015-09-12 DIAGNOSIS — I10 Essential (primary) hypertension: Secondary | ICD-10-CM | POA: Diagnosis not present

## 2015-09-12 DIAGNOSIS — R809 Proteinuria, unspecified: Secondary | ICD-10-CM | POA: Diagnosis not present

## 2015-09-12 DIAGNOSIS — E7800 Pure hypercholesterolemia, unspecified: Secondary | ICD-10-CM | POA: Diagnosis not present

## 2015-09-12 DIAGNOSIS — E1129 Type 2 diabetes mellitus with other diabetic kidney complication: Secondary | ICD-10-CM | POA: Diagnosis not present

## 2015-09-14 DIAGNOSIS — H401111 Primary open-angle glaucoma, right eye, mild stage: Secondary | ICD-10-CM | POA: Diagnosis not present

## 2015-10-04 DIAGNOSIS — H2513 Age-related nuclear cataract, bilateral: Secondary | ICD-10-CM | POA: Diagnosis not present

## 2015-10-11 NOTE — Discharge Instructions (Signed)

## 2015-10-18 ENCOUNTER — Ambulatory Visit: Payer: Commercial Managed Care - HMO | Admitting: Anesthesiology

## 2015-10-18 ENCOUNTER — Ambulatory Visit
Admission: RE | Admit: 2015-10-18 | Discharge: 2015-10-18 | Disposition: A | Discharge status: Home / Self Care | Payer: Commercial Managed Care - HMO | Source: Ambulatory Visit | Attending: Ophthalmology | Admitting: Ophthalmology

## 2015-10-18 ENCOUNTER — Encounter
Admission: RE | Disposition: A | Discharge status: Home / Self Care | Payer: Self-pay | Source: Ambulatory Visit | Attending: Ophthalmology

## 2015-10-18 DIAGNOSIS — M1990 Unspecified osteoarthritis, unspecified site: Secondary | ICD-10-CM | POA: Diagnosis not present

## 2015-10-18 DIAGNOSIS — I10 Essential (primary) hypertension: Secondary | ICD-10-CM | POA: Insufficient documentation

## 2015-10-18 DIAGNOSIS — M810 Age-related osteoporosis without current pathological fracture: Secondary | ICD-10-CM | POA: Insufficient documentation

## 2015-10-18 DIAGNOSIS — H2513 Age-related nuclear cataract, bilateral: Secondary | ICD-10-CM | POA: Diagnosis not present

## 2015-10-18 DIAGNOSIS — E119 Type 2 diabetes mellitus without complications: Secondary | ICD-10-CM | POA: Diagnosis not present

## 2015-10-18 DIAGNOSIS — H2511 Age-related nuclear cataract, right eye: Secondary | ICD-10-CM | POA: Diagnosis not present

## 2015-10-18 HISTORY — DX: Unspecified osteoarthritis, unspecified site: M19.90

## 2015-10-18 HISTORY — DX: Cardiac murmur, unspecified: R01.1

## 2015-10-18 HISTORY — PX: CATARACT EXTRACTION W/PHACO: SHX586

## 2015-10-18 HISTORY — DX: Diverticulitis of intestine, part unspecified, without perforation or abscess without bleeding: K57.92

## 2015-10-18 HISTORY — DX: Calculus of kidney: N20.0

## 2015-10-18 LAB — GLUCOSE, CAPILLARY
GLUCOSE-CAPILLARY: 115 mg/dL — AB (ref 65–99)
Glucose-Capillary: 112 mg/dL — ABNORMAL HIGH (ref 65–99)

## 2015-10-18 SURGERY — PHACOEMULSIFICATION, CATARACT, WITH IOL INSERTION
Anesthesia: Monitor Anesthesia Care | Laterality: Right | Wound class: Clean

## 2015-10-18 MED ORDER — NA HYALUR & NA CHOND-NA HYALUR 0.4-0.35 ML IO KIT
PACK | INTRAOCULAR | Status: DC | PRN
  Administered 2015-10-18: 1 mL via INTRAOCULAR

## 2015-10-18 MED ORDER — CEFUROXIME OPHTHALMIC INJECTION 1 MG/0.1 ML
INJECTION | OPHTHALMIC | Status: DC | PRN
  Administered 2015-10-18: 0.1 mL via OPHTHALMIC

## 2015-10-18 MED ORDER — ARMC OPHTHALMIC DILATING GEL
1.0000 "application " | OPHTHALMIC | Status: DC | PRN
  Administered 2015-10-18 (×2): 1 via OPHTHALMIC

## 2015-10-18 MED ORDER — MIDAZOLAM HCL 2 MG/2ML IJ SOLN
INTRAMUSCULAR | Status: DC | PRN
  Administered 2015-10-18: 1.5 mg via INTRAVENOUS

## 2015-10-18 MED ORDER — BRIMONIDINE TARTRATE 0.2 % OP SOLN
OPHTHALMIC | Status: DC | PRN
  Administered 2015-10-18: 1 [drp] via OPHTHALMIC

## 2015-10-18 MED ORDER — LACTATED RINGERS IV SOLN: INTRAVENOUS | Status: DC

## 2015-10-18 MED ORDER — POVIDONE-IODINE 5 % OP SOLN
1.0000 "application " | OPHTHALMIC | Status: DC | PRN
  Administered 2015-10-18: 1 via OPHTHALMIC

## 2015-10-18 MED ORDER — TIMOLOL MALEATE 0.5 % OP SOLN
OPHTHALMIC | Status: DC | PRN
  Administered 2015-10-18: 1 [drp] via OPHTHALMIC

## 2015-10-18 MED ORDER — BALANCED SALT IO SOLN
INTRAOCULAR | Status: DC | PRN
  Administered 2015-10-18: 1 mL via OPHTHALMIC

## 2015-10-18 MED ORDER — TETRACAINE HCL 0.5 % OP SOLN
1.0000 [drp] | OPHTHALMIC | Status: DC | PRN
  Administered 2015-10-18: 1 [drp] via OPHTHALMIC

## 2015-10-18 MED ORDER — FENTANYL CITRATE (PF) 100 MCG/2ML IJ SOLN
INTRAMUSCULAR | Status: DC | PRN
  Administered 2015-10-18: 50 ug via INTRAVENOUS

## 2015-10-18 MED ORDER — EPINEPHRINE HCL 1 MG/ML IJ SOLN
INTRAMUSCULAR | Status: DC | PRN
  Administered 2015-10-18: 74 mL via OPHTHALMIC

## 2015-10-18 SURGICAL SUPPLY — 25 items
CANNULA ANT/CHMB 27GA (MISCELLANEOUS) ×3 IMPLANT
CARTRIDGE ABBOTT (MISCELLANEOUS) IMPLANT
GLOVE SURG LX 7.5 STRW (GLOVE) ×2
GLOVE SURG LX STRL 7.5 STRW (GLOVE) ×1 IMPLANT
GLOVE SURG TRIUMPH 8.0 PF LTX (GLOVE) ×3 IMPLANT
GOWN STRL REUS W/ TWL LRG LVL3 (GOWN DISPOSABLE) ×2 IMPLANT
GOWN STRL REUS W/TWL LRG LVL3 (GOWN DISPOSABLE) ×4
LENS IOL TECNIS ITEC 19.0 (Intraocular Lens) ×3 IMPLANT
MARKER SKIN DUAL TIP RULER LAB (MISCELLANEOUS) ×3 IMPLANT
NDL RETROBULBAR .5 NSTRL (NEEDLE) IMPLANT
NEEDLE FILTER BLUNT 18X 1/2SAF (NEEDLE) ×2
NEEDLE FILTER BLUNT 18X1 1/2 (NEEDLE) ×1 IMPLANT
PACK CATARACT BRASINGTON (MISCELLANEOUS) ×3 IMPLANT
PACK EYE AFTER SURG (MISCELLANEOUS) ×3 IMPLANT
PACK OPTHALMIC (MISCELLANEOUS) ×3 IMPLANT
RING MALYGIN 7.0 (MISCELLANEOUS) IMPLANT
SUT ETHILON 10-0 CS-B-6CS-B-6 (SUTURE)
SUT VICRYL  9 0 (SUTURE)
SUT VICRYL 9 0 (SUTURE) IMPLANT
SUTURE EHLN 10-0 CS-B-6CS-B-6 (SUTURE) IMPLANT
SYR 3ML LL SCALE MARK (SYRINGE) ×3 IMPLANT
SYR 5ML LL (SYRINGE) ×3 IMPLANT
SYR TB 1ML LUER SLIP (SYRINGE) ×3 IMPLANT
WATER STERILE IRR 250ML POUR (IV SOLUTION) ×3 IMPLANT
WIPE NON LINTING 3.25X3.25 (MISCELLANEOUS) ×3 IMPLANT

## 2015-10-18 NOTE — Anesthesia Postprocedure Evaluation (Signed)
Anesthesia Post Note  Patient: Jordan Small  Procedure(s) Performed: Procedure(s) (LRB): CATARACT EXTRACTION PHACO AND INTRAOCULAR LENS PLACEMENT (IOC) (Right)  Patient location during evaluation: PACU Anesthesia Type: MAC Level of consciousness: awake and alert Pain management: pain level controlled Vital Signs Assessment: post-procedure vital signs reviewed and stable Respiratory status: spontaneous breathing, nonlabored ventilation, respiratory function stable and patient connected to nasal cannula oxygen Cardiovascular status: stable and blood pressure returned to baseline Anesthetic complications: no    Lissete Maestas

## 2015-10-18 NOTE — Transfer of Care (Signed)
Immediate Anesthesia Transfer of Care Note  Patient: Jordan Small  Procedure(s) Performed: Procedure(s) with comments: CATARACT EXTRACTION PHACO AND INTRAOCULAR LENS PLACEMENT (IOC) (Right) - DIABETIC - oral meds KEEP PT TIME AFTER 8 PER PT KEEP 5TH  Patient Location: PACU  Anesthesia Type: MAC  Level of Consciousness: awake, alert  and patient cooperative  Airway and Oxygen Therapy: Patient Spontanous Breathing and Patient connected to supplemental oxygen  Post-op Assessment: Post-op Vital signs reviewed, Patient's Cardiovascular Status Stable, Respiratory Function Stable, Patent Airway and No signs of Nausea or vomiting  Post-op Vital Signs: Reviewed and stable  Complications: No apparent anesthesia complications

## 2015-10-18 NOTE — H&P (Signed)
  The History and Physical notes are on paper, have been signed, and are to be scanned. The patient remains stable and unchanged from the H&P.   Previous H&P reviewed, patient examined, and there are no changes.  Jordan Small 10/18/2015 8:44 AM

## 2015-10-18 NOTE — Op Note (Signed)
LOCATION:  [place]   PREOPERATIVE DIAGNOSIS:    Nuclear sclerotic cataract right eye. H25.11   POSTOPERATIVE DIAGNOSIS:  Nuclear sclerotic cataract right eye.     PROCEDURE:  Phacoemusification with posterior chamber intraocular lens placement of the right eye   LENS:   Implant Name Type Inv. Item Serial No. Manufacturer Lot No. LRB No. Used  LENS IOL DIOP 19.0 - [id number] Intraocular Lens LENS IOL DIOP 19.0 [id number]   Right 1        ULTRASOUND TIME: 11 % of 1 minutes, 24 seconds.  CDE 8.9   SURGEON:  Wyonia Hough, MD   ANESTHESIA:  Topical with tetracaine drops and 2% Xylocaine jelly, augmented with 1% preservative-free intracameral lidocaine.    COMPLICATIONS:  None.   DESCRIPTION OF PROCEDURE:  The patient was identified in the holding room and transported to the operating room and placed in the supine position under the operating microscope.  The right eye was identified as the operative eye and it was prepped and draped in the usual sterile ophthalmic fashion.   A 1 millimeter clear-corneal paracentesis was made at the 12:00 position.  0.5 ml of preservative-free 1% lidocaine was injected into the anterior chamber. The anterior chamber was filled with Viscoat viscoelastic.  A 2.4 millimeter keratome was used to make a near-clear corneal incision at the 9:00 position.  A curvilinear capsulorrhexis was made with a cystotome and capsulorrhexis forceps.  Balanced salt solution was used to hydrodissect and hydrodelineate the nucleus.   Phacoemulsification was then used in stop and chop fashion to remove the lens nucleus and epinucleus.  The remaining cortex was then removed using the irrigation and aspiration handpiece. Provisc was then placed into the capsular bag to distend it for lens placement.  A lens was then injected into the capsular bag.  The remaining viscoelastic was aspirated.   Wounds were hydrated with balanced salt solution.  The anterior  chamber was inflated to a physiologic pressure with balanced salt solution.  No wound leaks were noted. Cefuroxime 0.1 ml of a 10mg /ml solution was injected into the anterior chamber for a dose of 1 mg of intracameral antibiotic at the completion of the case.   Timolol and Brimonidine drops were applied to the eye.  The patient was taken to the recovery room in stable condition without complications of anesthesia or surgery.   Kafi Dotter 10/18/2015, 9:49 AM

## 2015-10-18 NOTE — Anesthesia Preprocedure Evaluation (Addendum)
Anesthesia Evaluation  Patient identified by MRN, date of birth, ID band  Reviewed: NPO status   History of Anesthesia Complications (+) PONV and history of anesthetic complications  Airway Mallampati: II  TM Distance: >3 FB Neck ROM: full    Dental  (+) Upper Dentures, Lower Dentures   Pulmonary neg pulmonary ROS,    Pulmonary exam normal        Cardiovascular Exercise Tolerance: Good hypertension, Normal cardiovascular exam+ Valvular Problems/Murmurs (benign murmur)    Myoviewm stress test in 2013 was negative for ischemia. Normal ejection fraction noted.   med stable: 08/2015: dr.singh;    Neuro/Psych negative neurological ROS  negative psych ROS   GI/Hepatic negative GI ROS, Neg liver ROS,   Endo/Other  diabetes  Renal/GU negative Renal ROS  negative genitourinary   Musculoskeletal  (+) Arthritis ,   Abdominal   Peds  Hematology negative hematology ROS (+)   Anesthesia Other Findings   Reproductive/Obstetrics                            Anesthesia Physical Anesthesia Plan  ASA: II  Anesthesia Plan: MAC   Post-op Pain Management:    Induction:   Airway Management Planned:   Additional Equipment:   Intra-op Plan:   Post-operative Plan:   Informed Consent: I have reviewed the patients History and Physical, chart, labs and discussed the procedure including the risks, benefits and alternatives for the proposed anesthesia with the patient or authorized representative who has indicated his/her understanding and acceptance.     Plan Discussed with: CRNA  Anesthesia Plan Comments:        Anesthesia Quick Evaluation

## 2015-10-18 NOTE — Anesthesia Procedure Notes (Signed)
Procedure Name: MAC Performed by: Lashawnda Hancox Pre-anesthesia Checklist: Patient identified, Emergency Drugs available, Suction available, Timeout performed and Patient being monitored Patient Re-evaluated:Patient Re-evaluated prior to inductionOxygen Delivery Method: Nasal cannula Placement Confirmation: positive ETCO2       

## 2015-10-19 ENCOUNTER — Encounter: Payer: Self-pay | Admitting: Ophthalmology

## 2015-12-13 DIAGNOSIS — E1129 Type 2 diabetes mellitus with other diabetic kidney complication: Secondary | ICD-10-CM | POA: Diagnosis not present

## 2015-12-13 DIAGNOSIS — E7800 Pure hypercholesterolemia, unspecified: Secondary | ICD-10-CM | POA: Diagnosis not present

## 2015-12-13 DIAGNOSIS — I10 Essential (primary) hypertension: Secondary | ICD-10-CM | POA: Diagnosis not present

## 2015-12-13 DIAGNOSIS — R809 Proteinuria, unspecified: Secondary | ICD-10-CM | POA: Diagnosis not present

## 2015-12-13 DIAGNOSIS — Z23 Encounter for immunization: Secondary | ICD-10-CM | POA: Diagnosis not present

## 2016-02-21 DIAGNOSIS — H401111 Primary open-angle glaucoma, right eye, mild stage: Secondary | ICD-10-CM | POA: Diagnosis not present

## 2016-02-29 DIAGNOSIS — H401111 Primary open-angle glaucoma, right eye, mild stage: Secondary | ICD-10-CM | POA: Diagnosis not present

## 2016-03-19 DIAGNOSIS — R202 Paresthesia of skin: Secondary | ICD-10-CM | POA: Diagnosis not present

## 2016-03-19 DIAGNOSIS — M5442 Lumbago with sciatica, left side: Secondary | ICD-10-CM | POA: Diagnosis not present

## 2016-03-19 DIAGNOSIS — R809 Proteinuria, unspecified: Secondary | ICD-10-CM | POA: Diagnosis not present

## 2016-03-19 DIAGNOSIS — E1129 Type 2 diabetes mellitus with other diabetic kidney complication: Secondary | ICD-10-CM | POA: Diagnosis not present

## 2016-03-28 DIAGNOSIS — M5416 Radiculopathy, lumbar region: Secondary | ICD-10-CM | POA: Diagnosis not present

## 2016-03-28 DIAGNOSIS — M25562 Pain in left knee: Secondary | ICD-10-CM | POA: Diagnosis not present

## 2016-03-28 DIAGNOSIS — I10 Essential (primary) hypertension: Secondary | ICD-10-CM | POA: Diagnosis not present

## 2016-03-28 DIAGNOSIS — M545 Low back pain: Secondary | ICD-10-CM | POA: Diagnosis not present

## 2016-04-09 DIAGNOSIS — M6281 Muscle weakness (generalized): Secondary | ICD-10-CM | POA: Diagnosis not present

## 2016-04-09 DIAGNOSIS — M79605 Pain in left leg: Secondary | ICD-10-CM | POA: Diagnosis not present

## 2016-04-09 DIAGNOSIS — M5416 Radiculopathy, lumbar region: Secondary | ICD-10-CM | POA: Diagnosis not present

## 2016-04-11 DIAGNOSIS — M5416 Radiculopathy, lumbar region: Secondary | ICD-10-CM | POA: Diagnosis not present

## 2016-04-12 ENCOUNTER — Other Ambulatory Visit: Payer: Self-pay | Admitting: Internal Medicine

## 2016-04-12 DIAGNOSIS — M5416 Radiculopathy, lumbar region: Secondary | ICD-10-CM

## 2016-04-16 DIAGNOSIS — M79605 Pain in left leg: Secondary | ICD-10-CM | POA: Diagnosis not present

## 2016-04-16 DIAGNOSIS — M6281 Muscle weakness (generalized): Secondary | ICD-10-CM | POA: Diagnosis not present

## 2016-04-16 DIAGNOSIS — M5416 Radiculopathy, lumbar region: Secondary | ICD-10-CM | POA: Diagnosis not present

## 2016-04-17 ENCOUNTER — Encounter: Payer: Self-pay | Admitting: Radiology

## 2016-04-17 ENCOUNTER — Ambulatory Visit
Admission: RE | Admit: 2016-04-17 | Discharge: 2016-04-17 | Disposition: A | Discharge status: Home / Self Care | Payer: Medicare HMO | Source: Ambulatory Visit | Attending: Internal Medicine | Admitting: Internal Medicine

## 2016-04-17 DIAGNOSIS — M5416 Radiculopathy, lumbar region: Secondary | ICD-10-CM | POA: Insufficient documentation

## 2016-04-17 DIAGNOSIS — M5126 Other intervertebral disc displacement, lumbar region: Secondary | ICD-10-CM | POA: Insufficient documentation

## 2016-04-17 DIAGNOSIS — M4807 Spinal stenosis, lumbosacral region: Secondary | ICD-10-CM | POA: Diagnosis not present

## 2016-04-17 DIAGNOSIS — M48061 Spinal stenosis, lumbar region without neurogenic claudication: Secondary | ICD-10-CM | POA: Diagnosis not present

## 2016-04-17 DIAGNOSIS — M4316 Spondylolisthesis, lumbar region: Secondary | ICD-10-CM | POA: Diagnosis not present

## 2016-04-17 DIAGNOSIS — M1288 Other specific arthropathies, not elsewhere classified, other specified site: Secondary | ICD-10-CM | POA: Diagnosis not present

## 2016-04-17 DIAGNOSIS — M5127 Other intervertebral disc displacement, lumbosacral region: Secondary | ICD-10-CM | POA: Diagnosis not present

## 2016-04-17 LAB — POCT I-STAT CREATININE: Creatinine, Ser: 1.2 mg/dL — ABNORMAL HIGH (ref 0.44–1.00)

## 2016-04-17 MED ORDER — GADOBENATE DIMEGLUMINE 529 MG/ML IV SOLN
15.0000 mL | Freq: Once | INTRAVENOUS | Status: AC | PRN
  Administered 2016-04-17: 12 mL via INTRAVENOUS

## 2016-04-19 DIAGNOSIS — M5416 Radiculopathy, lumbar region: Secondary | ICD-10-CM | POA: Diagnosis not present

## 2016-04-22 DIAGNOSIS — M5416 Radiculopathy, lumbar region: Secondary | ICD-10-CM | POA: Diagnosis not present

## 2016-04-22 DIAGNOSIS — M1612 Unilateral primary osteoarthritis, left hip: Secondary | ICD-10-CM | POA: Diagnosis not present

## 2016-04-22 DIAGNOSIS — M48062 Spinal stenosis, lumbar region with neurogenic claudication: Secondary | ICD-10-CM | POA: Diagnosis not present

## 2016-04-22 DIAGNOSIS — M5136 Other intervertebral disc degeneration, lumbar region: Secondary | ICD-10-CM | POA: Diagnosis not present

## 2016-05-16 DIAGNOSIS — M5416 Radiculopathy, lumbar region: Secondary | ICD-10-CM | POA: Diagnosis not present

## 2016-05-16 DIAGNOSIS — M5136 Other intervertebral disc degeneration, lumbar region: Secondary | ICD-10-CM | POA: Diagnosis not present

## 2016-05-16 DIAGNOSIS — M48062 Spinal stenosis, lumbar region with neurogenic claudication: Secondary | ICD-10-CM | POA: Diagnosis not present

## 2016-06-03 DIAGNOSIS — E7800 Pure hypercholesterolemia, unspecified: Secondary | ICD-10-CM | POA: Diagnosis not present

## 2016-06-03 DIAGNOSIS — I10 Essential (primary) hypertension: Secondary | ICD-10-CM | POA: Diagnosis not present

## 2016-06-03 DIAGNOSIS — R809 Proteinuria, unspecified: Secondary | ICD-10-CM | POA: Diagnosis not present

## 2016-06-03 DIAGNOSIS — E1129 Type 2 diabetes mellitus with other diabetic kidney complication: Secondary | ICD-10-CM | POA: Diagnosis not present

## 2016-06-06 DIAGNOSIS — M48062 Spinal stenosis, lumbar region with neurogenic claudication: Secondary | ICD-10-CM | POA: Diagnosis not present

## 2016-06-06 DIAGNOSIS — I10 Essential (primary) hypertension: Secondary | ICD-10-CM | POA: Diagnosis not present

## 2016-06-06 DIAGNOSIS — M5136 Other intervertebral disc degeneration, lumbar region: Secondary | ICD-10-CM | POA: Diagnosis not present

## 2016-06-06 DIAGNOSIS — M5416 Radiculopathy, lumbar region: Secondary | ICD-10-CM | POA: Diagnosis not present

## 2016-06-10 DIAGNOSIS — Z0000 Encounter for general adult medical examination without abnormal findings: Secondary | ICD-10-CM | POA: Diagnosis not present

## 2016-06-10 DIAGNOSIS — Z1231 Encounter for screening mammogram for malignant neoplasm of breast: Secondary | ICD-10-CM | POA: Diagnosis not present

## 2016-06-10 DIAGNOSIS — E538 Deficiency of other specified B group vitamins: Secondary | ICD-10-CM | POA: Diagnosis not present

## 2016-06-10 DIAGNOSIS — R87612 Low grade squamous intraepithelial lesion on cytologic smear of cervix (LGSIL): Secondary | ICD-10-CM | POA: Diagnosis not present

## 2016-06-10 DIAGNOSIS — Z23 Encounter for immunization: Secondary | ICD-10-CM | POA: Diagnosis not present

## 2016-06-25 ENCOUNTER — Other Ambulatory Visit: Payer: Self-pay | Admitting: Internal Medicine

## 2016-06-25 DIAGNOSIS — Z1231 Encounter for screening mammogram for malignant neoplasm of breast: Secondary | ICD-10-CM

## 2016-07-11 DIAGNOSIS — E538 Deficiency of other specified B group vitamins: Secondary | ICD-10-CM | POA: Diagnosis not present

## 2016-07-12 ENCOUNTER — Ambulatory Visit
Admission: RE | Admit: 2016-07-12 | Discharge: 2016-07-12 | Disposition: A | Discharge status: Home / Self Care | Payer: Medicare HMO | Source: Ambulatory Visit | Attending: Internal Medicine | Admitting: Internal Medicine

## 2016-07-12 DIAGNOSIS — Z1231 Encounter for screening mammogram for malignant neoplasm of breast: Secondary | ICD-10-CM | POA: Diagnosis not present

## 2016-08-02 DIAGNOSIS — M5136 Other intervertebral disc degeneration, lumbar region: Secondary | ICD-10-CM | POA: Diagnosis not present

## 2016-08-02 DIAGNOSIS — M5416 Radiculopathy, lumbar region: Secondary | ICD-10-CM | POA: Diagnosis not present

## 2016-08-02 DIAGNOSIS — M48062 Spinal stenosis, lumbar region with neurogenic claudication: Secondary | ICD-10-CM | POA: Diagnosis not present

## 2016-08-02 DIAGNOSIS — M1612 Unilateral primary osteoarthritis, left hip: Secondary | ICD-10-CM | POA: Diagnosis not present

## 2016-08-12 DIAGNOSIS — E538 Deficiency of other specified B group vitamins: Secondary | ICD-10-CM | POA: Diagnosis not present

## 2016-08-15 DIAGNOSIS — N890 Mild vaginal dysplasia: Secondary | ICD-10-CM | POA: Diagnosis not present

## 2016-08-15 DIAGNOSIS — N761 Subacute and chronic vaginitis: Secondary | ICD-10-CM | POA: Diagnosis not present

## 2016-08-15 DIAGNOSIS — N888 Other specified noninflammatory disorders of cervix uteri: Secondary | ICD-10-CM | POA: Diagnosis not present

## 2016-08-29 DIAGNOSIS — H401111 Primary open-angle glaucoma, right eye, mild stage: Secondary | ICD-10-CM | POA: Diagnosis not present

## 2016-09-16 DIAGNOSIS — R809 Proteinuria, unspecified: Secondary | ICD-10-CM | POA: Diagnosis not present

## 2016-09-16 DIAGNOSIS — E538 Deficiency of other specified B group vitamins: Secondary | ICD-10-CM | POA: Diagnosis not present

## 2016-09-16 DIAGNOSIS — I10 Essential (primary) hypertension: Secondary | ICD-10-CM | POA: Diagnosis not present

## 2016-09-16 DIAGNOSIS — M8589 Other specified disorders of bone density and structure, multiple sites: Secondary | ICD-10-CM | POA: Diagnosis not present

## 2016-09-16 DIAGNOSIS — E1129 Type 2 diabetes mellitus with other diabetic kidney complication: Secondary | ICD-10-CM | POA: Diagnosis not present

## 2016-09-16 DIAGNOSIS — E7800 Pure hypercholesterolemia, unspecified: Secondary | ICD-10-CM | POA: Diagnosis not present

## 2016-09-19 DIAGNOSIS — M47816 Spondylosis without myelopathy or radiculopathy, lumbar region: Secondary | ICD-10-CM | POA: Diagnosis not present

## 2016-09-19 DIAGNOSIS — M48062 Spinal stenosis, lumbar region with neurogenic claudication: Secondary | ICD-10-CM | POA: Diagnosis not present

## 2016-10-02 DIAGNOSIS — E785 Hyperlipidemia, unspecified: Secondary | ICD-10-CM | POA: Diagnosis not present

## 2016-10-02 DIAGNOSIS — E7800 Pure hypercholesterolemia, unspecified: Secondary | ICD-10-CM | POA: Diagnosis not present

## 2016-10-02 DIAGNOSIS — M79605 Pain in left leg: Secondary | ICD-10-CM | POA: Diagnosis not present

## 2016-10-02 DIAGNOSIS — R809 Proteinuria, unspecified: Secondary | ICD-10-CM | POA: Diagnosis not present

## 2016-10-02 DIAGNOSIS — M79604 Pain in right leg: Secondary | ICD-10-CM | POA: Diagnosis not present

## 2016-10-02 DIAGNOSIS — E1129 Type 2 diabetes mellitus with other diabetic kidney complication: Secondary | ICD-10-CM | POA: Diagnosis not present

## 2016-10-02 DIAGNOSIS — D519 Vitamin B12 deficiency anemia, unspecified: Secondary | ICD-10-CM | POA: Diagnosis not present

## 2016-10-02 DIAGNOSIS — R011 Cardiac murmur, unspecified: Secondary | ICD-10-CM | POA: Diagnosis not present

## 2016-10-02 DIAGNOSIS — I10 Essential (primary) hypertension: Secondary | ICD-10-CM | POA: Diagnosis not present

## 2016-10-02 DIAGNOSIS — M8589 Other specified disorders of bone density and structure, multiple sites: Secondary | ICD-10-CM | POA: Diagnosis not present

## 2016-10-02 DIAGNOSIS — H409 Unspecified glaucoma: Secondary | ICD-10-CM | POA: Diagnosis not present

## 2016-10-02 DIAGNOSIS — M5416 Radiculopathy, lumbar region: Secondary | ICD-10-CM | POA: Diagnosis not present

## 2016-10-02 DIAGNOSIS — Z01818 Encounter for other preprocedural examination: Secondary | ICD-10-CM | POA: Diagnosis not present

## 2016-10-08 DIAGNOSIS — Z4789 Encounter for other orthopedic aftercare: Secondary | ICD-10-CM | POA: Diagnosis not present

## 2016-10-08 DIAGNOSIS — E1129 Type 2 diabetes mellitus with other diabetic kidney complication: Secondary | ICD-10-CM | POA: Diagnosis not present

## 2016-10-08 DIAGNOSIS — M810 Age-related osteoporosis without current pathological fracture: Secondary | ICD-10-CM | POA: Diagnosis not present

## 2016-10-08 DIAGNOSIS — Z90710 Acquired absence of both cervix and uterus: Secondary | ICD-10-CM | POA: Diagnosis not present

## 2016-10-08 DIAGNOSIS — M6281 Muscle weakness (generalized): Secondary | ICD-10-CM | POA: Diagnosis not present

## 2016-10-08 DIAGNOSIS — I350 Nonrheumatic aortic (valve) stenosis: Secondary | ICD-10-CM | POA: Diagnosis not present

## 2016-10-08 DIAGNOSIS — D519 Vitamin B12 deficiency anemia, unspecified: Secondary | ICD-10-CM | POA: Diagnosis not present

## 2016-10-08 DIAGNOSIS — M48062 Spinal stenosis, lumbar region with neurogenic claudication: Secondary | ICD-10-CM | POA: Diagnosis not present

## 2016-10-08 DIAGNOSIS — R2689 Other abnormalities of gait and mobility: Secondary | ICD-10-CM | POA: Diagnosis not present

## 2016-10-08 DIAGNOSIS — E1151 Type 2 diabetes mellitus with diabetic peripheral angiopathy without gangrene: Secondary | ICD-10-CM | POA: Diagnosis not present

## 2016-10-08 DIAGNOSIS — E7800 Pure hypercholesterolemia, unspecified: Secondary | ICD-10-CM | POA: Diagnosis not present

## 2016-10-08 DIAGNOSIS — M5416 Radiculopathy, lumbar region: Secondary | ICD-10-CM | POA: Diagnosis not present

## 2016-10-08 DIAGNOSIS — H409 Unspecified glaucoma: Secondary | ICD-10-CM | POA: Diagnosis not present

## 2016-10-08 DIAGNOSIS — M545 Low back pain: Secondary | ICD-10-CM | POA: Diagnosis not present

## 2016-10-08 DIAGNOSIS — K5790 Diverticulosis of intestine, part unspecified, without perforation or abscess without bleeding: Secondary | ICD-10-CM | POA: Diagnosis not present

## 2016-10-08 DIAGNOSIS — Z87891 Personal history of nicotine dependence: Secondary | ICD-10-CM | POA: Diagnosis not present

## 2016-10-08 DIAGNOSIS — I10 Essential (primary) hypertension: Secondary | ICD-10-CM | POA: Diagnosis not present

## 2016-10-08 HISTORY — PX: OTHER SURGICAL HISTORY: SHX169

## 2016-10-11 ENCOUNTER — Encounter
Admission: RE | Admit: 2016-10-11 | Discharge: 2016-10-11 | Disposition: A | Discharge status: Home / Self Care | Payer: Commercial Managed Care - HMO | Source: Ambulatory Visit | Attending: Internal Medicine | Admitting: Internal Medicine

## 2016-10-11 DIAGNOSIS — R2689 Other abnormalities of gait and mobility: Secondary | ICD-10-CM | POA: Diagnosis not present

## 2016-10-11 DIAGNOSIS — M545 Low back pain: Secondary | ICD-10-CM | POA: Diagnosis not present

## 2016-10-11 DIAGNOSIS — R112 Nausea with vomiting, unspecified: Secondary | ICD-10-CM | POA: Diagnosis not present

## 2016-10-11 DIAGNOSIS — M6281 Muscle weakness (generalized): Secondary | ICD-10-CM | POA: Diagnosis not present

## 2016-10-11 DIAGNOSIS — E1129 Type 2 diabetes mellitus with other diabetic kidney complication: Secondary | ICD-10-CM | POA: Diagnosis not present

## 2016-10-11 DIAGNOSIS — D519 Vitamin B12 deficiency anemia, unspecified: Secondary | ICD-10-CM | POA: Diagnosis not present

## 2016-10-11 DIAGNOSIS — I10 Essential (primary) hypertension: Secondary | ICD-10-CM | POA: Diagnosis not present

## 2016-10-11 DIAGNOSIS — M5416 Radiculopathy, lumbar region: Secondary | ICD-10-CM | POA: Diagnosis not present

## 2016-10-11 DIAGNOSIS — R809 Proteinuria, unspecified: Secondary | ICD-10-CM | POA: Diagnosis not present

## 2016-10-11 DIAGNOSIS — H409 Unspecified glaucoma: Secondary | ICD-10-CM | POA: Diagnosis not present

## 2016-10-11 DIAGNOSIS — M48062 Spinal stenosis, lumbar region with neurogenic claudication: Secondary | ICD-10-CM | POA: Diagnosis not present

## 2016-10-11 DIAGNOSIS — M810 Age-related osteoporosis without current pathological fracture: Secondary | ICD-10-CM | POA: Diagnosis not present

## 2016-10-11 DIAGNOSIS — Z4789 Encounter for other orthopedic aftercare: Secondary | ICD-10-CM | POA: Diagnosis not present

## 2016-10-11 DIAGNOSIS — I350 Nonrheumatic aortic (valve) stenosis: Secondary | ICD-10-CM | POA: Diagnosis not present

## 2016-10-14 ENCOUNTER — Other Ambulatory Visit: Payer: Self-pay

## 2016-10-14 DIAGNOSIS — I10 Essential (primary) hypertension: Secondary | ICD-10-CM | POA: Diagnosis not present

## 2016-10-14 DIAGNOSIS — M5416 Radiculopathy, lumbar region: Secondary | ICD-10-CM | POA: Diagnosis not present

## 2016-10-14 DIAGNOSIS — R809 Proteinuria, unspecified: Secondary | ICD-10-CM | POA: Diagnosis not present

## 2016-10-14 DIAGNOSIS — E1129 Type 2 diabetes mellitus with other diabetic kidney complication: Secondary | ICD-10-CM | POA: Diagnosis not present

## 2016-10-14 DIAGNOSIS — I350 Nonrheumatic aortic (valve) stenosis: Secondary | ICD-10-CM | POA: Diagnosis not present

## 2016-10-14 MED ORDER — OXYCODONE HCL 5 MG PO TABS: 5.0000 mg | ORAL_TABLET | ORAL | 0 refills | Status: DC | PRN

## 2016-10-14 NOTE — Telephone Encounter (Signed)
Rx sent to [facility] phone : [phone] , fax : [phone]  

## 2016-10-19 ENCOUNTER — Encounter
Admission: RE | Admit: 2016-10-19 | Discharge: 2016-10-19 | Disposition: A | Discharge status: Home / Self Care | Payer: Medicare HMO | Source: Ambulatory Visit | Attending: Internal Medicine | Admitting: Internal Medicine

## 2016-10-22 ENCOUNTER — Non-Acute Institutional Stay (SKILLED_NURSING_FACILITY): Payer: Medicare HMO | Admitting: Gerontology

## 2016-10-22 DIAGNOSIS — M5416 Radiculopathy, lumbar region: Secondary | ICD-10-CM

## 2016-10-24 ENCOUNTER — Other Ambulatory Visit: Payer: Self-pay

## 2016-10-24 MED ORDER — TRAMADOL HCL 50 MG PO TABS: 50.0000 mg | ORAL_TABLET | ORAL | 0 refills | Status: DC | PRN

## 2016-10-24 NOTE — Telephone Encounter (Signed)
Rx sent to [facility] phone : [phone] , fax : [phone]  

## 2016-10-25 ENCOUNTER — Encounter: Payer: Self-pay | Admitting: Gerontology

## 2016-10-25 NOTE — Progress Notes (Signed)
Location:   [facility] Room Number: 207B Place of Service:  SNF [phone]) Provider:  Toni Arthurs, NP-C  Glendon Axe, MD  Patient Care Team: Glendon Axe, MD as PCP - General (Internal Medicine)  Extended Emergency Contact Information Primary Emergency Contact: Redditt,Shane Address: [address]          [place], [place] [address] Johnnette Litter of [place] Phone: [phone] Relation: Son Secondary Emergency Contact: Jimmey Ralph [place] of [place] Phone: [phone] Work Phone: [phone] Relation: Son  Code Status:  DNR Goals of care: Advanced Directive information Advanced Directives 10/25/2016  Does Patient Have a Medical Advance Directive? Yes  Type of Advance Directive Out of facility DNR (pink MOST or yellow form)  Does patient want to make changes to medical advance directive? No - Patient declined  Would patient like information on creating a medical advance directive? -     Chief Complaint  Patient presents with  . Hospitalization Follow-up    Follow up on lumbar fusion    HPI:  Pt is a 76 y.o. female seen today for a hospital f/u s/p admission from [facility] following lumbar fusion. Pt has been participating with PT/OT. She is having a significant amount of pain and has only been taking tylenol for the pain. She would like to have a little something stronger for the pain, especially after therapy. She reports her appetite is good and is having regular BMs. Her incision is CDI with steri-strips in place. She c/o thigh pain that is aching in nature, worse at night and keeping her awake. She reports therapy applied some Biofreeze and she says that is working well. Otherwise, pt denies peripheral neuropathies. VSS. No other complaints.   Past Medical History:  Diagnosis Date  . Anemia, unspecified   . Arthritis   . Diabetes mellitus without complication ([place])   . Disorder of bursae and tendons in shoulder  region    unspecified  . Diverticulitis    had a GI bleed with this in the past  . Diverticulosis   . Essential hypertension, benign   . GERD (gastroesophageal reflux disease)   . Heart murmur    followed by PCP  . History of GI bleed   . Hypertension   . Mitral valve disorder   . Nephrolithiasis   . Osteoporosis   . Pure hypercholesterolemia   . 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 ANTERIOR THORACIC SPINE FUSION MULTIPLE LEVELS  10/08/2016   Procedure: ARTHRODESIS, ANTERIOR INTERBODY TECHNIQUE, INCLUDING MINIMAL DISCECTOMY TO PREPARE INTERSPACE (OTHER THAN FOR DECOMPRESSION); LUMBAR; Surgeon: Ricard Dillon, MD; Location: [place]; Service: Neurosurgery; Laterality: N/A;  . ARTHRODESIS ANTEROR CERVICAL SPINE  10/08/2016   Procedure: ARTHRODESIS, ANTERIOR INTERBODY TECHNIQUE, INCLUDING MINIMAL DISCECTOMY TO PREPARE INTERSPACE (OTHER THAN DECOMPRESSION); EACH ADDITIONAL INTERSPACE (LIST IN ADDITION TO CODE FOR PRIMARY PROCEDURE); Surgeon: Ricard Dillon, MD; Location: [place]; Service: Neurosurgery; Laterality: N/A;  . BREAST EXCISIONAL BIOPSY Left 1970   neg  . CATARACT EXTRACTION W/PHACO Right 10/18/2015   Procedure: CATARACT EXTRACTION PHACO AND INTRAOCULAR LENS PLACEMENT (IOC);  Surgeon: Leandrew Koyanagi, MD;  Location: [place];  Service: Ophthalmology;  Laterality: Right;  DIABETIC - oral meds KEEP PT TIME AFTER 8 PER PT KEEP 5TH  . COLPOSCOPY  2017   vag colp-neg [facility]  . INSTRUMENTATION  POSTERIOR SPINE 3 TO 6 VERTERAL SEGMENTS  10/08/2016   Procedure: POSTERIOR SEGMENTAL INSTRUMENTATION (EG, PEDICLE FIXATION, DUAL RODS WITH MULTIPLE HOOKS AND SUBLAMINAR WIRES); 3 TO 6 VERTEBRAL SEGMENTS (LIST IN ADDITION TO PRIMARY PROCEDURE); Surgeon: Ricard Dillon, MD; Location: [place]; Service: Neurosurgery; Laterality: N/A;  . PARTIAL HYSTERECTOMY     in the 70's  . POSTERIOR LUMBAR SPINE FUSION ONE LEVEL LATERAL TRANSVERSE TECHNIQUE  10/08/2016   Procedure: ARTHRODESIS, POSTERIOR OR POSTEROLATERAL TECHNIQUE, SINGLE LEVEL; LUMBAR (WITH LATERAL TRANSVERSE TECHNIQUE, WHEN PERFORMED); Surgeon: Ricard Dillon, MD; Location: [place]; Service: Neurosurgery; Laterality: N/A;  . STRESS FRACTURE OF RIGHT FOOT    . TOTAL VAGINAL HYSTERECTOMY      Allergies  Allergen Reactions  . Shellfish Allergy Nausea And Vomiting    Allergies as of 10/22/2016      Reactions   Shellfish Allergy Nausea And Vomiting      Medication List       Accurate as of 10/22/16 11:59 PM. Always use your most recent med list.          acetaminophen 325 MG tablet Commonly known as:  TYLENOL Take 650 mg by mouth every 4 (four) hours as needed. For pain or increased fever. Max dose for 24 hr is 3000 mg from all sources of Apap/tylenol   alendronate 35 MG tablet Commonly known as:  FOSAMAX Take 35 mg by mouth every 7 (seven) days. On Fridays. Take with a full glass of water on an empty stomach.   amLODipine 2.5 MG tablet Commonly known as:  NORVASC Take 2.5 mg by mouth 2 (two) times daily.   atorvastatin 40 MG tablet Commonly known as:  LIPITOR Take 40 mg by mouth daily.   calcium carbonate 1500 (600 Ca) MG Tabs tablet Commonly known as:  OSCAL Take by mouth 2 (two) times daily with a meal.   ferrous gluconate 324 MG tablet Commonly known as:  FERGON Take 324 mg by mouth 2 (two) times daily with a meal.   glipiZIDE 10 MG tablet Commonly known as:  GLUCOTROL Take 5 mg by mouth at bedtime. 1/2 tablet   glipiZIDE 10 MG tablet Commonly known as:  GLUCOTROL Take 10 mg by mouth daily before breakfast.   hydrochlorothiazide 25 MG tablet Commonly known as:  HYDRODIURIL Take 25 mg by mouth daily.   lisinopril 40 MG tablet Commonly known as:  PRINIVIL,ZESTRIL Take 40 mg by  mouth daily.   metformin 1000 MG (OSM) 24 hr tablet Commonly known as:  FORTAMET Take 1,000 mg by mouth daily at 6 PM.   metFORMIN 500 MG tablet Commonly known as:  GLUCOPHAGE Take 500 mg by mouth daily with breakfast.   metoprolol succinate 25 MG 24 hr tablet Commonly known as:  TOPROL-XL Take 25 mg by mouth daily.   promethazine 25 MG tablet Commonly known as:  PHENERGAN Take 12.5-25 mg by mouth every 8 (eight) hours as needed for nausea or vomiting. 1/2 - 1 tablet   sitaGLIPtin 100 MG tablet Commonly known as:  JANUVIA Take 100 mg by mouth daily.   timolol 0.25 % ophthalmic solution Commonly known as:  TIMOPTIC Place 1 drop into the right eye 2 (two) times daily.   traMADol 50 MG tablet Commonly known as:  ULTRAM Take 1-2 tablets (50-100 mg total) by mouth every 4 (four) hours as needed for moderate pain or severe pain. 1 tablet for moderate pain and 2 tablets for severe pain   vitamin C 500 MG tablet  Commonly known as:  ASCORBIC ACID Take 500 mg by mouth 2 (two) times daily.       Review of Systems  Constitutional: Negative for activity change, appetite change, chills, diaphoresis and fever.  HENT: Negative for congestion, sneezing, sore throat, trouble swallowing and voice change.   Eyes: Negative for pain.  Respiratory: Negative for apnea, cough, choking, chest tightness, shortness of breath and wheezing.   Cardiovascular: Negative for chest pain, palpitations and leg swelling.  Gastrointestinal: Negative for abdominal distention, abdominal pain, constipation, diarrhea and nausea.  Genitourinary: Negative for difficulty urinating, dysuria, frequency and urgency.  Musculoskeletal: Positive for arthralgias (typical arthritis), back pain and myalgias. Negative for gait problem.  Skin: Positive for wound. Negative for color change, pallor and rash.  Neurological: Negative for dizziness, tremors, syncope, speech difficulty, weakness, numbness and headaches.    Psychiatric/Behavioral: Negative for agitation and behavioral problems.  All other systems reviewed and are negative.   Immunization History  Administered Date(s) Administered  . Influenza Inj Mdck Quad Pf 12/13/2015  . Influenza Split 11/25/2014  . Pneumococcal Polysaccharide-23 06/07/2014   Pertinent  Health Maintenance Due  Topic Date Due  . COLONOSCOPY  11/11/1990  . DEXA SCAN  11/10/2005  . PNA vac Low Risk Adult (1 of 2 - PCV13) 11/10/2005  . INFLUENZA VACCINE  09/18/2016   No flowsheet data found. Functional Status Survey:    Vitals:   10/22/16 0917  BP: (!) 167/86  Pulse: 67  Resp: 18  Temp: 98.1 F (36.7 C)  SpO2: 97%  Weight: 145 lb 8 oz (66 kg)  Height: 5\' 1"  (1.549 m)   Body mass index is 27.49 kg/m. Physical Exam  Constitutional: She is oriented to person, place, and time. Vital signs are normal. She appears well-developed and well-nourished. She is active and cooperative. She does not appear ill. No distress.  HENT:  Head: Normocephalic and atraumatic.  Mouth/Throat: Uvula is midline, oropharynx is clear and moist and mucous membranes are normal. Mucous membranes are not pale, not dry and not cyanotic.  Eyes: Pupils are equal, round, and reactive to light. Conjunctivae, EOM and lids are normal.  Neck: Trachea normal, normal range of motion and full passive range of motion without pain. Neck supple. No JVD present. No tracheal deviation, no edema and no erythema present. No thyromegaly present.  Cardiovascular: Normal rate, regular rhythm, normal heart sounds, intact distal pulses and normal pulses.  Exam reveals no gallop, no distant heart sounds and no friction rub.   No murmur heard. Pulmonary/Chest: Effort normal and breath sounds normal. No accessory muscle usage. No respiratory distress. She has no wheezes. She has no rales. She exhibits no tenderness.  Abdominal: Normal appearance and bowel sounds are normal. She exhibits no distension and no ascites.  There is no tenderness.  Musculoskeletal: She exhibits no edema.       Lumbar back: She exhibits decreased range of motion, tenderness and laceration.  Expected osteoarthritis, stiffness; calves soft, supple. Negative Homan's sign  Neurological: She is alert and oriented to person, place, and time. She has normal strength.  Skin: Skin is warm and dry. Laceration (lumbar incision) noted. No rash noted. She is not diaphoretic. No cyanosis or erythema. No pallor. Nails show no clubbing.  Psychiatric: She has a normal mood and affect. Her speech is normal and behavior is normal. Judgment and thought content normal. Cognition and memory are normal.  Nursing note and vitals reviewed.   Labs reviewed:  Recent Labs  04/17/16 [phone]  CREATININE 1.20*   No results for input(s): AST, ALT, ALKPHOS, BILITOT, PROT, ALBUMIN in the last 8760 hours. No results for input(s): WBC, NEUTROABS, HGB, HCT, MCV, PLT in the last 8760 hours. No results found for: TSH Lab Results  Component Value Date   HGBA1C 7.1 (H) 10/23/2012   No results found for: CHOL, HDL, LDLCALC, LDLDIRECT, TRIG, CHOLHDL  Significant Diagnostic Results in last 30 days:  No results found.  Assessment/Plan 1. Lumbar radiculitis  Continue working with PT/OT  Continue exercises as taught by PT/OT  Continue ice pack to the back prn  Continue Back Precautions   Continue skin/wound care per protocol  Continue Tylenol 650 mg po Q 4 hours prn pain  Add Tramadol 50 mg 1-2 tablets po Q 4 hours prn for pain  Follow up with Neuro-surgeon as instructed   Family/ staff Communication:   Total Time:  Documentation:  Face to Face:  Family/Phone:   Labs/tests ordered:    Vikki Ports, NP-C [facility] [address], [place] [address] Cell Phone (Mon-Fri 8am-5pm):  [phone] On Call:  [phone] & follow prompts after 5pm & weekends Office Phone:   [phone] Office Fax:  [phone]

## 2016-10-28 ENCOUNTER — Encounter: Payer: Self-pay | Admitting: Gerontology

## 2016-10-28 ENCOUNTER — Non-Acute Institutional Stay (SKILLED_NURSING_FACILITY): Payer: Medicare HMO | Admitting: Gerontology

## 2016-10-28 DIAGNOSIS — R112 Nausea with vomiting, unspecified: Secondary | ICD-10-CM

## 2016-10-28 DIAGNOSIS — M5416 Radiculopathy, lumbar region: Secondary | ICD-10-CM

## 2016-10-28 NOTE — Progress Notes (Signed)
Location:   [facility] Room Number: 207B Place of Service:  SNF [phone])  Provider: Toni Arthurs, NP-C  PCP: Glendon Axe, MD Patient Care Team: Glendon Axe, MD as PCP - General (Internal Medicine)  Extended Emergency Contact Information Primary Emergency Contact: Brumbaugh,Shane Address: [address]          [place], [place] [address] Johnnette Litter of [place] Phone: [phone] Relation: Son Secondary Emergency Contact: Jimmey Ralph States of [place] Phone: [phone] Work Phone: [phone] Relation: Son  Code Status: DNR Goals of care:  Advanced Directive information Advanced Directives 10/28/2016  Does Patient Have a Medical Advance Directive? Yes  Type of Advance Directive Out of facility DNR (pink MOST or yellow form)  Does patient want to make changes to medical advance directive? No - Patient declined  Would patient like information on creating a medical advance directive? -     Allergies  Allergen Reactions  . Shellfish Allergy Nausea And Vomiting    Chief Complaint  Patient presents with  . Discharge Note    Discharged from SNF    HPI:  76 y.o. female seen today for discharge evaluation s/p admission from [facility] following lumbar fusion. Pt has been participating with PT/OT. She was having a significant amount of pain and had only been taking tylenol for the pain. She was ordered prn Tramadol. She reports this is working well for her. She reports her appetite is good and is having regular BMs. Her incision is CDI with steri-strips in place. She did c/o thigh pain that is aching in nature, worse at night and keeping her awake. She reports Biofreeze is working well. Otherwise, pt denies peripheral neuropathies. Pt also c/o nausea and vomiting today. She attributes it to taking her morning medications on an empty stomach. Requests a dose of Zofran to reduce the nausea. VSS. No other complaints.       Past Medical History:  Diagnosis Date  . Anemia, unspecified   . Arthritis   . Diabetes mellitus without complication ([place])   . Disorder of bursae and tendons in shoulder region    unspecified  . Diverticulitis    had a GI bleed with this in the past  . Diverticulosis   . Essential hypertension, benign   . GERD (gastroesophageal reflux disease)   . Heart murmur    followed by PCP  . History of GI bleed   . Hypertension   . Mitral valve disorder   . Nephrolithiasis   . Osteoporosis   . Pure hypercholesterolemia   . 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 ([place])    unspecified type diabetes without mention of complication, not stated as uncontrolled    Past Surgical History:  Procedure Laterality Date  . ANTERIOR THORACIC SPINE FUSION MULTIPLE LEVELS  10/08/2016   Procedure: ARTHRODESIS, ANTERIOR INTERBODY TECHNIQUE, INCLUDING MINIMAL DISCECTOMY TO PREPARE INTERSPACE (OTHER THAN FOR DECOMPRESSION); LUMBAR; Surgeon: Ricard Dillon, MD; Location: [place]; Service: Neurosurgery; Laterality: N/A;  . ARTHRODESIS ANTEROR CERVICAL SPINE  10/08/2016   Procedure: ARTHRODESIS, ANTERIOR INTERBODY TECHNIQUE, INCLUDING MINIMAL DISCECTOMY TO PREPARE INTERSPACE (OTHER THAN DECOMPRESSION); EACH ADDITIONAL INTERSPACE (LIST IN ADDITION TO CODE FOR PRIMARY PROCEDURE); Surgeon: Ricard Dillon, MD; Location: [place]; Service: Neurosurgery; Laterality: N/A;  . BREAST EXCISIONAL BIOPSY Left 1970   neg  . CATARACT EXTRACTION W/PHACO Right 10/18/2015   Procedure: CATARACT EXTRACTION PHACO AND INTRAOCULAR LENS PLACEMENT (IOC);  Surgeon: Leandrew Koyanagi, MD;  Location: [place];  Service: Ophthalmology;  Laterality: Right;  DIABETIC - oral meds KEEP PT TIME AFTER 8 PER PT KEEP 5TH  . COLPOSCOPY  2017   vag colp-neg TVH  . INSTRUMENTATION POSTERIOR SPINE 3 TO 6 VERTERAL SEGMENTS  10/08/2016   Procedure: POSTERIOR SEGMENTAL INSTRUMENTATION (EG,  PEDICLE FIXATION, DUAL RODS WITH MULTIPLE HOOKS AND SUBLAMINAR WIRES); 3 TO 6 VERTEBRAL SEGMENTS (LIST IN ADDITION TO PRIMARY PROCEDURE); Surgeon: Ricard Dillon, MD; Location: [place]; Service: Neurosurgery; Laterality: N/A;  . PARTIAL HYSTERECTOMY     in the 70's  . POSTERIOR LUMBAR SPINE FUSION ONE LEVEL LATERAL TRANSVERSE TECHNIQUE  10/08/2016   Procedure: ARTHRODESIS, POSTERIOR OR POSTEROLATERAL TECHNIQUE, SINGLE LEVEL; LUMBAR (WITH LATERAL TRANSVERSE TECHNIQUE, WHEN PERFORMED); Surgeon: Ricard Dillon, MD; Location: [place]; Service: Neurosurgery; Laterality: N/A;  . STRESS FRACTURE OF RIGHT FOOT    . TOTAL VAGINAL HYSTERECTOMY        reports that she quit smoking about 33 years ago. Her smoking use included Cigarettes. She has a 40.00 pack-year smoking history. She has never used smokeless tobacco. She reports that she drinks about 1.2 oz of alcohol per week . Her drug history is not on file. Social History   Social History  . Marital status: Divorced    Spouse name: N/A  . Number of children: 3  . Years of education: college   Occupational History  .      retired Community education officer   Social History Main Topics  . Smoking status: Former Smoker    Packs/day: 2.00    Years: 20.00    Types: Cigarettes    Quit date: 62  . Smokeless tobacco: Never Used  . Alcohol use 1.2 oz/week    2 Glasses of wine per week     Comment: socially  . Drug use: Unknown  . Sexual activity: Not on file   Other Topics Concern  . Not on file   Social History Narrative   Former smoker   Drinks 2 glasses of wine socially   3 sons   Divorced   DNR   Functional Status Survey:    Allergies  Allergen Reactions  . Shellfish Allergy Nausea And Vomiting    Pertinent  Health Maintenance Due  Topic Date Due  . COLONOSCOPY  11/11/1990  . DEXA SCAN  11/10/2005  . PNA vac Low Risk Adult (2 of 2 - PCV13) 06/07/2015  . INFLUENZA VACCINE  09/18/2016     Medications: Allergies as of 10/28/2016      Reactions   Shellfish Allergy Nausea And Vomiting      Medication List       Accurate as of 10/28/16  2:01 PM. Always use your most recent med list.          acetaminophen 325 MG tablet Commonly known as:  TYLENOL Take 650 mg by mouth every 4 (four) hours as needed. For pain or increased fever. Max dose for 24 hr is 3000 mg from all sources of Apap/tylenol   alendronate 35 MG tablet Commonly known as:  FOSAMAX Take 35 mg by mouth every 7 (seven) days. On Fridays. Take with a full glass of water on an empty stomach.   amLODipine 2.5 MG tablet Commonly known as:  NORVASC Take 2.5 mg by mouth 2 (two) times daily.   atorvastatin 40 MG tablet Commonly known as:  LIPITOR Take 40 mg by mouth daily.   calcium carbonate 1500 (600 Ca) MG Tabs tablet Commonly known as:  OSCAL Take by mouth  2 (two) times daily with a meal.   ferrous gluconate 324 MG tablet Commonly known as:  FERGON Take 324 mg by mouth 2 (two) times daily with a meal.   glipiZIDE 10 MG tablet Commonly known as:  GLUCOTROL Take 10 mg by mouth daily before breakfast.   glipiZIDE 10 MG tablet Commonly known as:  GLUCOTROL Take 5 mg by mouth at bedtime. 1/2 tablet   hydrochlorothiazide 25 MG tablet Commonly known as:  HYDRODIURIL Take 25 mg by mouth daily.   lisinopril 40 MG tablet Commonly known as:  PRINIVIL,ZESTRIL Take 40 mg by mouth daily.   metformin 1000 MG (OSM) 24 hr tablet Commonly known as:  FORTAMET Take 1,000 mg by mouth daily at 6 PM.   metFORMIN 500 MG tablet Commonly known as:  GLUCOPHAGE Take 500 mg by mouth daily with breakfast.   metoprolol succinate 25 MG 24 hr tablet Commonly known as:  TOPROL-XL Take 25 mg by mouth daily.   promethazine 25 MG tablet Commonly known as:  PHENERGAN Take 12.5-25 mg by mouth every 8 (eight) hours as needed for nausea or vomiting. 1/2 - 1 tablet   sitaGLIPtin 100 MG tablet Commonly known as:   JANUVIA Take 100 mg by mouth daily.   timolol 0.25 % ophthalmic solution Commonly known as:  TIMOPTIC Place 1 drop into the right eye 2 (two) times daily.   traMADol 50 MG tablet Commonly known as:  ULTRAM Take 1-2 tablets (50-100 mg total) by mouth every 4 (four) hours as needed for moderate pain or severe pain. 1 tablet for moderate pain and 2 tablets for severe pain   vitamin C 500 MG tablet Commonly known as:  ASCORBIC ACID Take 500 mg by mouth 2 (two) times daily.       Review of Systems  Constitutional: Negative for activity change, appetite change, chills, diaphoresis and fever.  HENT: Negative for congestion, sneezing, sore throat, trouble swallowing and voice change.   Respiratory: Negative for apnea, cough, choking, chest tightness, shortness of breath and wheezing.   Cardiovascular: Negative for chest pain, palpitations and leg swelling.  Gastrointestinal: Positive for nausea and vomiting. Negative for abdominal distention, abdominal pain, constipation and diarrhea.  Genitourinary: Negative for difficulty urinating, dysuria, frequency and urgency.  Musculoskeletal: Positive for arthralgias (typical arthritis), back pain and myalgias. Negative for gait problem.  Skin: Positive for wound. Negative for color change, pallor and rash.  Neurological: Negative for dizziness, tremors, syncope, speech difficulty, weakness, numbness and headaches.  Psychiatric/Behavioral: Negative for agitation and behavioral problems.  All other systems reviewed and are negative.   Vitals:   10/28/16 1354  BP: 138/75  Pulse: 78  Resp: 20  Temp: 98.4 F (36.9 C)  SpO2: 100%  Weight: 136 lb (61.7 kg)  Height: 5\' 1"  (1.549 m)   Body mass index is 25.7 kg/m. Physical Exam  Constitutional: She is oriented to person, place, and time. Vital signs are normal. She appears well-developed and well-nourished. She is active and cooperative. She does not appear ill. No distress.  HENT:  Head:  Normocephalic and atraumatic.  Mouth/Throat: Uvula is midline, oropharynx is clear and moist and mucous membranes are normal. Mucous membranes are not pale, not dry and not cyanotic.  Eyes: Pupils are equal, round, and reactive to light. Conjunctivae, EOM and lids are normal.  Neck: Trachea normal, normal range of motion and full passive range of motion without pain. Neck supple. No JVD present. No tracheal deviation, no edema and no erythema present.  No thyromegaly present.  Cardiovascular: Normal rate, regular rhythm, normal heart sounds, intact distal pulses and normal pulses.  Exam reveals no gallop, no distant heart sounds and no friction rub.   No murmur heard. Pulmonary/Chest: Effort normal and breath sounds normal. No accessory muscle usage. No respiratory distress. She has no wheezes. She has no rales. She exhibits no tenderness.  Abdominal: Normal appearance and bowel sounds are normal. She exhibits no distension and no ascites. There is no tenderness.  Musculoskeletal: She exhibits no edema.       Lumbar back: She exhibits decreased range of motion, tenderness and laceration.  Expected osteoarthritis, stiffness; calves soft, supple. Negative Homan's sign  Neurological: She is alert and oriented to person, place, and time. She has normal strength.  Skin: Skin is warm and dry. Laceration (lumbar incision) noted. No rash noted. She is not diaphoretic. No cyanosis or erythema. No pallor. Nails show no clubbing.  Psychiatric: She has a normal mood and affect. Her speech is normal and behavior is normal. Judgment and thought content normal. Cognition and memory are normal.  Nursing note and vitals reviewed.   Labs reviewed: Basic Metabolic Panel:  Recent Labs  04/17/16 0926  CREATININE 1.20*   Liver Function Tests: No results for input(s): AST, ALT, ALKPHOS, BILITOT, PROT, ALBUMIN in the last 8760 hours. No results for input(s): LIPASE, AMYLASE in the last 8760 hours. No results for  input(s): AMMONIA in the last 8760 hours. CBC: No results for input(s): WBC, NEUTROABS, HGB, HCT, MCV, PLT in the last 8760 hours. Cardiac Enzymes: No results for input(s): CKTOTAL, CKMB, CKMBINDEX, TROPONINI in the last 8760 hours. BNP: Invalid input(s): POCBNP CBG: No results for input(s): GLUCAP in the last 8760 hours.  Procedures and Imaging Studies During Stay: No results found.  Assessment/Plan:   1. Lumbar radiculitis  Continue working with PT/OT  Continue exercises as taught by PT/OT  Continue ice pack to the back prn  Continue Back Precautions   Continue skin/wound care per protocol  Continue Biofreeze BID to thighs  Continue Tylenol 650 mg po Q 4 hours prn pain  Continue Tramadol 50 mg 1-2 tablets po Q 4 hours prn for pain # 30, no refill  Follow up with Neuro-surgeon as instructed  2. Nausea and vomiting, intractability of vomiting not specified, unspecified vomiting type  Zofran 4 mg po x 1 now  Patient is being discharged with the following home health services: HHPT/OT through [facility]    Patient is being discharged with the following durable medical equipment: RW, 3n1 [facility]   Patient has been advised to f/u with their PCP in 1-2 weeks to bring them up to date on their rehab stay.  Social services at facility was responsible for arranging this appointment.  Pt was provided with a 30 day supply of prescriptions for medications and refills must be obtained from their PCP.  For controlled substances, a more limited supply may be provided adequate until PCP appointment only.  Future labs/tests needed:    Family/ staff Communication:   Total Time:  Documentation:  Face to Face:  Family/Phone:  Vikki Ports, NP-C [facility] [address], [place] [address] Cell Phone (Mon-Fri 8am-5pm):  [phone] On Call:  [phone] & follow prompts after 5pm & weekends Office Phone:   [phone] Office Fax:  [phone]

## 2016-10-29 ENCOUNTER — Other Ambulatory Visit: Payer: Self-pay | Admitting: *Deleted

## 2016-10-29 DIAGNOSIS — I350 Nonrheumatic aortic (valve) stenosis: Secondary | ICD-10-CM | POA: Diagnosis not present

## 2016-10-29 DIAGNOSIS — D649 Anemia, unspecified: Secondary | ICD-10-CM | POA: Diagnosis not present

## 2016-10-29 DIAGNOSIS — M810 Age-related osteoporosis without current pathological fracture: Secondary | ICD-10-CM | POA: Diagnosis not present

## 2016-10-29 DIAGNOSIS — M1990 Unspecified osteoarthritis, unspecified site: Secondary | ICD-10-CM | POA: Diagnosis not present

## 2016-10-29 DIAGNOSIS — E119 Type 2 diabetes mellitus without complications: Secondary | ICD-10-CM | POA: Diagnosis not present

## 2016-10-29 DIAGNOSIS — I10 Essential (primary) hypertension: Secondary | ICD-10-CM | POA: Diagnosis not present

## 2016-10-29 DIAGNOSIS — M48062 Spinal stenosis, lumbar region with neurogenic claudication: Secondary | ICD-10-CM | POA: Diagnosis not present

## 2016-10-29 DIAGNOSIS — Z4789 Encounter for other orthopedic aftercare: Secondary | ICD-10-CM | POA: Diagnosis not present

## 2016-10-29 DIAGNOSIS — Z981 Arthrodesis status: Secondary | ICD-10-CM | POA: Diagnosis not present

## 2016-10-29 NOTE — Patient Outreach (Signed)
[facility]) Care Management  10/29/2016  Jordan Small [birth year] [id number]  Referral via [facility]; patient recently discharged from inpatient admission from [facility] place on 10/28/2016(skilled facility)  Per chart review: Dx lumbar stenosis with neurogenic claudification.  Surgery; lumbar diskectomy with spinal fusion L3-L5. Other dxs: DM2, HTN, anemia, osteoporosis, mitral valve disorder  Telephone call #1 to patient who was advised of reason for call & of [facility] care management services. HIPPA verification received from patient.   Patient voices that she recently had back surgery at [facility] and was transferred to skilled facility for therapy services. States she was discharged to home 9/10 where she lives alone. States using walker to get around. Voices that she has niece that lives across the street who helps her & also has son that will be coming by after work to check on her.   Voices that she has [facility] for RN & physical therapy services. Voiced that RN came out today to see her and will return Thurs.  States therapist will come out later.   Voices that she manages her own medications & takes as instructed by her MD. States she is not having much pain but has pain medication to use when needed.  Voices that family members will take her to MD appointments.   Patient states she already has home health services & did not need any more help. Patient was advised of difference in home health services & [facility] care management.   Patient declined [facility] services but consents to contact information to use if needed.   Plan: Send contact information to patient. Send MD closure letter. Send to care management assistant to close out.   [name], RN BSN [place] Management Coordinator [facility] Care Management  [phone]

## 2016-10-30 ENCOUNTER — Encounter: Payer: Self-pay | Admitting: *Deleted

## 2016-10-30 DIAGNOSIS — M5416 Radiculopathy, lumbar region: Secondary | ICD-10-CM | POA: Insufficient documentation

## 2016-10-30 DIAGNOSIS — I10 Essential (primary) hypertension: Secondary | ICD-10-CM | POA: Diagnosis not present

## 2016-10-30 DIAGNOSIS — Z4789 Encounter for other orthopedic aftercare: Secondary | ICD-10-CM | POA: Diagnosis not present

## 2016-10-30 DIAGNOSIS — D649 Anemia, unspecified: Secondary | ICD-10-CM | POA: Diagnosis not present

## 2016-10-30 DIAGNOSIS — M1990 Unspecified osteoarthritis, unspecified site: Secondary | ICD-10-CM | POA: Diagnosis not present

## 2016-10-30 DIAGNOSIS — Z981 Arthrodesis status: Secondary | ICD-10-CM | POA: Diagnosis not present

## 2016-10-30 DIAGNOSIS — I350 Nonrheumatic aortic (valve) stenosis: Secondary | ICD-10-CM | POA: Diagnosis not present

## 2016-10-30 DIAGNOSIS — E119 Type 2 diabetes mellitus without complications: Secondary | ICD-10-CM | POA: Diagnosis not present

## 2016-10-30 DIAGNOSIS — M810 Age-related osteoporosis without current pathological fracture: Secondary | ICD-10-CM | POA: Diagnosis not present

## 2016-10-30 DIAGNOSIS — M48062 Spinal stenosis, lumbar region with neurogenic claudication: Secondary | ICD-10-CM | POA: Diagnosis not present

## 2016-10-31 DIAGNOSIS — M1990 Unspecified osteoarthritis, unspecified site: Secondary | ICD-10-CM | POA: Diagnosis not present

## 2016-10-31 DIAGNOSIS — Z981 Arthrodesis status: Secondary | ICD-10-CM | POA: Diagnosis not present

## 2016-10-31 DIAGNOSIS — Z4789 Encounter for other orthopedic aftercare: Secondary | ICD-10-CM | POA: Diagnosis not present

## 2016-10-31 DIAGNOSIS — I350 Nonrheumatic aortic (valve) stenosis: Secondary | ICD-10-CM | POA: Diagnosis not present

## 2016-10-31 DIAGNOSIS — M810 Age-related osteoporosis without current pathological fracture: Secondary | ICD-10-CM | POA: Diagnosis not present

## 2016-10-31 DIAGNOSIS — I10 Essential (primary) hypertension: Secondary | ICD-10-CM | POA: Diagnosis not present

## 2016-10-31 DIAGNOSIS — D649 Anemia, unspecified: Secondary | ICD-10-CM | POA: Diagnosis not present

## 2016-10-31 DIAGNOSIS — E119 Type 2 diabetes mellitus without complications: Secondary | ICD-10-CM | POA: Diagnosis not present

## 2016-10-31 DIAGNOSIS — M48062 Spinal stenosis, lumbar region with neurogenic claudication: Secondary | ICD-10-CM | POA: Diagnosis not present

## 2016-11-05 DIAGNOSIS — M810 Age-related osteoporosis without current pathological fracture: Secondary | ICD-10-CM | POA: Diagnosis not present

## 2016-11-05 DIAGNOSIS — M48062 Spinal stenosis, lumbar region with neurogenic claudication: Secondary | ICD-10-CM | POA: Diagnosis not present

## 2016-11-05 DIAGNOSIS — E119 Type 2 diabetes mellitus without complications: Secondary | ICD-10-CM | POA: Diagnosis not present

## 2016-11-05 DIAGNOSIS — I10 Essential (primary) hypertension: Secondary | ICD-10-CM | POA: Diagnosis not present

## 2016-11-05 DIAGNOSIS — I350 Nonrheumatic aortic (valve) stenosis: Secondary | ICD-10-CM | POA: Diagnosis not present

## 2016-11-05 DIAGNOSIS — M1990 Unspecified osteoarthritis, unspecified site: Secondary | ICD-10-CM | POA: Diagnosis not present

## 2016-11-05 DIAGNOSIS — D649 Anemia, unspecified: Secondary | ICD-10-CM | POA: Diagnosis not present

## 2016-11-05 DIAGNOSIS — Z4789 Encounter for other orthopedic aftercare: Secondary | ICD-10-CM | POA: Diagnosis not present

## 2016-11-05 DIAGNOSIS — Z981 Arthrodesis status: Secondary | ICD-10-CM | POA: Diagnosis not present

## 2016-11-06 DIAGNOSIS — I10 Essential (primary) hypertension: Secondary | ICD-10-CM | POA: Diagnosis not present

## 2016-11-06 DIAGNOSIS — M48062 Spinal stenosis, lumbar region with neurogenic claudication: Secondary | ICD-10-CM | POA: Diagnosis not present

## 2016-11-06 DIAGNOSIS — E119 Type 2 diabetes mellitus without complications: Secondary | ICD-10-CM | POA: Diagnosis not present

## 2016-11-06 DIAGNOSIS — I350 Nonrheumatic aortic (valve) stenosis: Secondary | ICD-10-CM | POA: Diagnosis not present

## 2016-11-06 DIAGNOSIS — M1990 Unspecified osteoarthritis, unspecified site: Secondary | ICD-10-CM | POA: Diagnosis not present

## 2016-11-06 DIAGNOSIS — Z981 Arthrodesis status: Secondary | ICD-10-CM | POA: Diagnosis not present

## 2016-11-06 DIAGNOSIS — D649 Anemia, unspecified: Secondary | ICD-10-CM | POA: Diagnosis not present

## 2016-11-06 DIAGNOSIS — M810 Age-related osteoporosis without current pathological fracture: Secondary | ICD-10-CM | POA: Diagnosis not present

## 2016-11-06 DIAGNOSIS — Z4789 Encounter for other orthopedic aftercare: Secondary | ICD-10-CM | POA: Diagnosis not present

## 2016-11-07 DIAGNOSIS — I10 Essential (primary) hypertension: Secondary | ICD-10-CM | POA: Diagnosis not present

## 2016-11-07 DIAGNOSIS — Z981 Arthrodesis status: Secondary | ICD-10-CM | POA: Diagnosis not present

## 2016-11-07 DIAGNOSIS — D649 Anemia, unspecified: Secondary | ICD-10-CM | POA: Diagnosis not present

## 2016-11-07 DIAGNOSIS — M48062 Spinal stenosis, lumbar region with neurogenic claudication: Secondary | ICD-10-CM | POA: Diagnosis not present

## 2016-11-07 DIAGNOSIS — E119 Type 2 diabetes mellitus without complications: Secondary | ICD-10-CM | POA: Diagnosis not present

## 2016-11-07 DIAGNOSIS — M810 Age-related osteoporosis without current pathological fracture: Secondary | ICD-10-CM | POA: Diagnosis not present

## 2016-11-07 DIAGNOSIS — I350 Nonrheumatic aortic (valve) stenosis: Secondary | ICD-10-CM | POA: Diagnosis not present

## 2016-11-07 DIAGNOSIS — M1990 Unspecified osteoarthritis, unspecified site: Secondary | ICD-10-CM | POA: Diagnosis not present

## 2016-11-07 DIAGNOSIS — Z4789 Encounter for other orthopedic aftercare: Secondary | ICD-10-CM | POA: Diagnosis not present

## 2016-11-08 DIAGNOSIS — D649 Anemia, unspecified: Secondary | ICD-10-CM | POA: Diagnosis not present

## 2016-11-08 DIAGNOSIS — I10 Essential (primary) hypertension: Secondary | ICD-10-CM | POA: Diagnosis not present

## 2016-11-08 DIAGNOSIS — Z4789 Encounter for other orthopedic aftercare: Secondary | ICD-10-CM | POA: Diagnosis not present

## 2016-11-08 DIAGNOSIS — I350 Nonrheumatic aortic (valve) stenosis: Secondary | ICD-10-CM | POA: Diagnosis not present

## 2016-11-08 DIAGNOSIS — E119 Type 2 diabetes mellitus without complications: Secondary | ICD-10-CM | POA: Diagnosis not present

## 2016-11-08 DIAGNOSIS — M1990 Unspecified osteoarthritis, unspecified site: Secondary | ICD-10-CM | POA: Diagnosis not present

## 2016-11-08 DIAGNOSIS — M48062 Spinal stenosis, lumbar region with neurogenic claudication: Secondary | ICD-10-CM | POA: Diagnosis not present

## 2016-11-08 DIAGNOSIS — M810 Age-related osteoporosis without current pathological fracture: Secondary | ICD-10-CM | POA: Diagnosis not present

## 2016-11-08 DIAGNOSIS — Z981 Arthrodesis status: Secondary | ICD-10-CM | POA: Diagnosis not present

## 2016-11-11 DIAGNOSIS — E119 Type 2 diabetes mellitus without complications: Secondary | ICD-10-CM | POA: Diagnosis not present

## 2016-11-11 DIAGNOSIS — Z981 Arthrodesis status: Secondary | ICD-10-CM | POA: Diagnosis not present

## 2016-11-11 DIAGNOSIS — D649 Anemia, unspecified: Secondary | ICD-10-CM | POA: Diagnosis not present

## 2016-11-11 DIAGNOSIS — Z4789 Encounter for other orthopedic aftercare: Secondary | ICD-10-CM | POA: Diagnosis not present

## 2016-11-11 DIAGNOSIS — M810 Age-related osteoporosis without current pathological fracture: Secondary | ICD-10-CM | POA: Diagnosis not present

## 2016-11-11 DIAGNOSIS — I10 Essential (primary) hypertension: Secondary | ICD-10-CM | POA: Diagnosis not present

## 2016-11-11 DIAGNOSIS — M48062 Spinal stenosis, lumbar region with neurogenic claudication: Secondary | ICD-10-CM | POA: Diagnosis not present

## 2016-11-11 DIAGNOSIS — M1990 Unspecified osteoarthritis, unspecified site: Secondary | ICD-10-CM | POA: Diagnosis not present

## 2016-11-11 DIAGNOSIS — I350 Nonrheumatic aortic (valve) stenosis: Secondary | ICD-10-CM | POA: Diagnosis not present

## 2016-11-13 DIAGNOSIS — M48062 Spinal stenosis, lumbar region with neurogenic claudication: Secondary | ICD-10-CM | POA: Diagnosis not present

## 2016-11-13 DIAGNOSIS — D649 Anemia, unspecified: Secondary | ICD-10-CM | POA: Diagnosis not present

## 2016-11-13 DIAGNOSIS — M1990 Unspecified osteoarthritis, unspecified site: Secondary | ICD-10-CM | POA: Diagnosis not present

## 2016-11-13 DIAGNOSIS — I350 Nonrheumatic aortic (valve) stenosis: Secondary | ICD-10-CM | POA: Diagnosis not present

## 2016-11-13 DIAGNOSIS — E119 Type 2 diabetes mellitus without complications: Secondary | ICD-10-CM | POA: Diagnosis not present

## 2016-11-13 DIAGNOSIS — Z4789 Encounter for other orthopedic aftercare: Secondary | ICD-10-CM | POA: Diagnosis not present

## 2016-11-13 DIAGNOSIS — M810 Age-related osteoporosis without current pathological fracture: Secondary | ICD-10-CM | POA: Diagnosis not present

## 2016-11-13 DIAGNOSIS — Z981 Arthrodesis status: Secondary | ICD-10-CM | POA: Diagnosis not present

## 2016-11-13 DIAGNOSIS — I10 Essential (primary) hypertension: Secondary | ICD-10-CM | POA: Diagnosis not present

## 2016-11-18 DIAGNOSIS — Z981 Arthrodesis status: Secondary | ICD-10-CM | POA: Diagnosis not present

## 2016-11-18 DIAGNOSIS — M1990 Unspecified osteoarthritis, unspecified site: Secondary | ICD-10-CM | POA: Diagnosis not present

## 2016-11-18 DIAGNOSIS — I350 Nonrheumatic aortic (valve) stenosis: Secondary | ICD-10-CM | POA: Diagnosis not present

## 2016-11-18 DIAGNOSIS — M48062 Spinal stenosis, lumbar region with neurogenic claudication: Secondary | ICD-10-CM | POA: Diagnosis not present

## 2016-11-18 DIAGNOSIS — M810 Age-related osteoporosis without current pathological fracture: Secondary | ICD-10-CM | POA: Diagnosis not present

## 2016-11-18 DIAGNOSIS — D649 Anemia, unspecified: Secondary | ICD-10-CM | POA: Diagnosis not present

## 2016-11-18 DIAGNOSIS — I10 Essential (primary) hypertension: Secondary | ICD-10-CM | POA: Diagnosis not present

## 2016-11-18 DIAGNOSIS — Z4789 Encounter for other orthopedic aftercare: Secondary | ICD-10-CM | POA: Diagnosis not present

## 2016-11-18 DIAGNOSIS — E119 Type 2 diabetes mellitus without complications: Secondary | ICD-10-CM | POA: Diagnosis not present

## 2016-11-20 DIAGNOSIS — M810 Age-related osteoporosis without current pathological fracture: Secondary | ICD-10-CM | POA: Diagnosis not present

## 2016-11-20 DIAGNOSIS — I350 Nonrheumatic aortic (valve) stenosis: Secondary | ICD-10-CM | POA: Diagnosis not present

## 2016-11-20 DIAGNOSIS — I10 Essential (primary) hypertension: Secondary | ICD-10-CM | POA: Diagnosis not present

## 2016-11-20 DIAGNOSIS — D649 Anemia, unspecified: Secondary | ICD-10-CM | POA: Diagnosis not present

## 2016-11-20 DIAGNOSIS — Z4789 Encounter for other orthopedic aftercare: Secondary | ICD-10-CM | POA: Diagnosis not present

## 2016-11-20 DIAGNOSIS — Z981 Arthrodesis status: Secondary | ICD-10-CM | POA: Diagnosis not present

## 2016-11-20 DIAGNOSIS — M1990 Unspecified osteoarthritis, unspecified site: Secondary | ICD-10-CM | POA: Diagnosis not present

## 2016-11-20 DIAGNOSIS — E119 Type 2 diabetes mellitus without complications: Secondary | ICD-10-CM | POA: Diagnosis not present

## 2016-11-20 DIAGNOSIS — M48062 Spinal stenosis, lumbar region with neurogenic claudication: Secondary | ICD-10-CM | POA: Diagnosis not present

## 2016-11-21 DIAGNOSIS — Z981 Arthrodesis status: Secondary | ICD-10-CM | POA: Diagnosis not present

## 2016-11-21 DIAGNOSIS — M4326 Fusion of spine, lumbar region: Secondary | ICD-10-CM | POA: Diagnosis not present

## 2016-11-21 DIAGNOSIS — M48062 Spinal stenosis, lumbar region with neurogenic claudication: Secondary | ICD-10-CM | POA: Diagnosis not present

## 2016-12-10 DIAGNOSIS — R809 Proteinuria, unspecified: Secondary | ICD-10-CM | POA: Diagnosis not present

## 2016-12-10 DIAGNOSIS — E1129 Type 2 diabetes mellitus with other diabetic kidney complication: Secondary | ICD-10-CM | POA: Diagnosis not present

## 2016-12-17 DIAGNOSIS — E538 Deficiency of other specified B group vitamins: Secondary | ICD-10-CM | POA: Diagnosis not present

## 2016-12-17 DIAGNOSIS — R197 Diarrhea, unspecified: Secondary | ICD-10-CM | POA: Diagnosis not present

## 2016-12-17 DIAGNOSIS — E7800 Pure hypercholesterolemia, unspecified: Secondary | ICD-10-CM | POA: Diagnosis not present

## 2016-12-17 DIAGNOSIS — R809 Proteinuria, unspecified: Secondary | ICD-10-CM | POA: Diagnosis not present

## 2016-12-17 DIAGNOSIS — M5136 Other intervertebral disc degeneration, lumbar region: Secondary | ICD-10-CM | POA: Diagnosis not present

## 2016-12-17 DIAGNOSIS — E1129 Type 2 diabetes mellitus with other diabetic kidney complication: Secondary | ICD-10-CM | POA: Diagnosis not present

## 2016-12-17 DIAGNOSIS — Z23 Encounter for immunization: Secondary | ICD-10-CM | POA: Diagnosis not present

## 2016-12-17 DIAGNOSIS — I10 Essential (primary) hypertension: Secondary | ICD-10-CM | POA: Diagnosis not present

## 2016-12-20 ENCOUNTER — Telehealth: Payer: Self-pay

## 2016-12-20 NOTE — Telephone Encounter (Signed)
Copied from [place] #[id number]. Topic: Inquiry >> Dec 20, 2016  3:12 PM Malena Catholic I, [place] wrote: Reason for CRM:  Gerald Stabs from [facility] home [facility], he wants to continue doing Physical Therapy with the patient. He needs an order for this to continue seeing the patient. His number is [phone]

## 2016-12-26 DIAGNOSIS — Z1211 Encounter for screening for malignant neoplasm of colon: Secondary | ICD-10-CM | POA: Diagnosis not present

## 2016-12-30 DIAGNOSIS — R197 Diarrhea, unspecified: Secondary | ICD-10-CM | POA: Diagnosis not present

## 2017-01-02 DIAGNOSIS — M4327 Fusion of spine, lumbosacral region: Secondary | ICD-10-CM | POA: Diagnosis not present

## 2017-01-02 DIAGNOSIS — M48062 Spinal stenosis, lumbar region with neurogenic claudication: Secondary | ICD-10-CM | POA: Diagnosis not present

## 2017-01-02 DIAGNOSIS — Z981 Arthrodesis status: Secondary | ICD-10-CM | POA: Diagnosis not present

## 2017-01-17 DIAGNOSIS — E538 Deficiency of other specified B group vitamins: Secondary | ICD-10-CM | POA: Diagnosis not present

## 2017-02-19 DIAGNOSIS — E538 Deficiency of other specified B group vitamins: Secondary | ICD-10-CM | POA: Diagnosis not present

## 2017-02-27 DIAGNOSIS — H401111 Primary open-angle glaucoma, right eye, mild stage: Secondary | ICD-10-CM | POA: Diagnosis not present

## 2017-03-06 DIAGNOSIS — H401131 Primary open-angle glaucoma, bilateral, mild stage: Secondary | ICD-10-CM | POA: Diagnosis not present

## 2017-03-06 DIAGNOSIS — H04123 Dry eye syndrome of bilateral lacrimal glands: Secondary | ICD-10-CM | POA: Diagnosis not present

## 2017-03-06 DIAGNOSIS — Z961 Presence of intraocular lens: Secondary | ICD-10-CM | POA: Diagnosis not present

## 2017-03-14 DIAGNOSIS — E1129 Type 2 diabetes mellitus with other diabetic kidney complication: Secondary | ICD-10-CM | POA: Diagnosis not present

## 2017-03-14 DIAGNOSIS — R42 Dizziness and giddiness: Secondary | ICD-10-CM | POA: Diagnosis not present

## 2017-03-14 DIAGNOSIS — I10 Essential (primary) hypertension: Secondary | ICD-10-CM | POA: Diagnosis not present

## 2017-03-14 DIAGNOSIS — R809 Proteinuria, unspecified: Secondary | ICD-10-CM | POA: Diagnosis not present

## 2017-03-20 DIAGNOSIS — R809 Proteinuria, unspecified: Secondary | ICD-10-CM | POA: Diagnosis not present

## 2017-03-20 DIAGNOSIS — E1129 Type 2 diabetes mellitus with other diabetic kidney complication: Secondary | ICD-10-CM | POA: Diagnosis not present

## 2017-03-20 DIAGNOSIS — I10 Essential (primary) hypertension: Secondary | ICD-10-CM | POA: Diagnosis not present

## 2017-03-20 DIAGNOSIS — E538 Deficiency of other specified B group vitamins: Secondary | ICD-10-CM | POA: Diagnosis not present

## 2017-03-20 DIAGNOSIS — K529 Noninfective gastroenteritis and colitis, unspecified: Secondary | ICD-10-CM | POA: Diagnosis not present

## 2017-03-24 DIAGNOSIS — E538 Deficiency of other specified B group vitamins: Secondary | ICD-10-CM | POA: Diagnosis not present

## 2017-04-24 DIAGNOSIS — E538 Deficiency of other specified B group vitamins: Secondary | ICD-10-CM | POA: Diagnosis not present

## 2017-05-08 DIAGNOSIS — K529 Noninfective gastroenteritis and colitis, unspecified: Secondary | ICD-10-CM | POA: Diagnosis not present

## 2017-05-09 ENCOUNTER — Other Ambulatory Visit
Admission: RE | Admit: 2017-05-09 | Discharge: 2017-05-09 | Disposition: A | Discharge status: Home / Self Care | Payer: Medicare HMO | Source: Ambulatory Visit | Attending: Student | Admitting: Student

## 2017-05-09 DIAGNOSIS — K529 Noninfective gastroenteritis and colitis, unspecified: Secondary | ICD-10-CM | POA: Diagnosis present

## 2017-05-09 DIAGNOSIS — K5289 Other specified noninfective gastroenteritis and colitis: Secondary | ICD-10-CM | POA: Diagnosis not present

## 2017-05-09 DIAGNOSIS — R197 Diarrhea, unspecified: Secondary | ICD-10-CM | POA: Diagnosis not present

## 2017-05-09 LAB — GASTROINTESTINAL PANEL BY PCR, STOOL (REPLACES STOOL CULTURE)
ADENOVIRUS F40/41: NOT DETECTED
Astrovirus: NOT DETECTED
CYCLOSPORA CAYETANENSIS: NOT DETECTED
Campylobacter species: NOT DETECTED
Cryptosporidium: NOT DETECTED
ENTAMOEBA HISTOLYTICA: NOT DETECTED
ENTEROAGGREGATIVE E COLI (EAEC): NOT DETECTED
ENTEROPATHOGENIC E COLI (EPEC): NOT DETECTED
Enterotoxigenic E coli (ETEC): NOT DETECTED
GIARDIA LAMBLIA: NOT DETECTED
Norovirus GI/GII: NOT DETECTED
Plesimonas shigelloides: NOT DETECTED
Rotavirus A: NOT DETECTED
SHIGELLA/ENTEROINVASIVE E COLI (EIEC): NOT DETECTED
Salmonella species: NOT DETECTED
Sapovirus (I, II, IV, and V): NOT DETECTED
Shiga like toxin producing E coli (STEC): NOT DETECTED
Vibrio cholerae: NOT DETECTED
Vibrio species: NOT DETECTED
Yersinia enterocolitica: NOT DETECTED

## 2017-05-09 LAB — C DIFFICILE QUICK SCREEN W PCR REFLEX
C Diff antigen: POSITIVE — AB
C Diff toxin: NEGATIVE

## 2017-05-09 LAB — CLOSTRIDIUM DIFFICILE BY PCR, REFLEXED: Toxigenic C. Difficile by PCR: POSITIVE — AB

## 2017-05-11 LAB — PANCREATIC ELASTASE, FECAL: Pancreatic Elastase-1, Stool: >500 ug Elast./g (ref >200)

## 2017-05-13 LAB — CALPROTECTIN, FECAL: Calprotectin, Fecal: 19 ug/g (ref 0–120)

## 2017-06-09 DIAGNOSIS — R809 Proteinuria, unspecified: Secondary | ICD-10-CM | POA: Diagnosis not present

## 2017-06-09 DIAGNOSIS — I10 Essential (primary) hypertension: Secondary | ICD-10-CM | POA: Diagnosis not present

## 2017-06-09 DIAGNOSIS — E538 Deficiency of other specified B group vitamins: Secondary | ICD-10-CM | POA: Diagnosis not present

## 2017-06-09 DIAGNOSIS — E1129 Type 2 diabetes mellitus with other diabetic kidney complication: Secondary | ICD-10-CM | POA: Diagnosis not present

## 2017-06-16 ENCOUNTER — Other Ambulatory Visit: Payer: Self-pay | Admitting: Internal Medicine

## 2017-06-16 DIAGNOSIS — R809 Proteinuria, unspecified: Secondary | ICD-10-CM | POA: Diagnosis not present

## 2017-06-16 DIAGNOSIS — Z1231 Encounter for screening mammogram for malignant neoplasm of breast: Secondary | ICD-10-CM | POA: Diagnosis not present

## 2017-06-16 DIAGNOSIS — Z1239 Encounter for other screening for malignant neoplasm of breast: Secondary | ICD-10-CM

## 2017-06-16 DIAGNOSIS — M8589 Other specified disorders of bone density and structure, multiple sites: Secondary | ICD-10-CM | POA: Diagnosis not present

## 2017-06-16 DIAGNOSIS — E7800 Pure hypercholesterolemia, unspecified: Secondary | ICD-10-CM | POA: Diagnosis not present

## 2017-06-16 DIAGNOSIS — R87612 Low grade squamous intraepithelial lesion on cytologic smear of cervix (LGSIL): Secondary | ICD-10-CM | POA: Diagnosis not present

## 2017-06-16 DIAGNOSIS — E1129 Type 2 diabetes mellitus with other diabetic kidney complication: Secondary | ICD-10-CM | POA: Diagnosis not present

## 2017-06-16 DIAGNOSIS — Z0000 Encounter for general adult medical examination without abnormal findings: Secondary | ICD-10-CM | POA: Diagnosis not present

## 2017-06-25 DIAGNOSIS — M8589 Other specified disorders of bone density and structure, multiple sites: Secondary | ICD-10-CM | POA: Diagnosis not present

## 2017-07-15 DIAGNOSIS — E538 Deficiency of other specified B group vitamins: Secondary | ICD-10-CM | POA: Diagnosis not present

## 2017-07-17 ENCOUNTER — Encounter: Payer: Self-pay | Admitting: *Deleted

## 2017-07-18 ENCOUNTER — Other Ambulatory Visit: Payer: Self-pay

## 2017-07-18 ENCOUNTER — Ambulatory Visit: Payer: Medicare HMO | Admitting: Anesthesiology

## 2017-07-18 ENCOUNTER — Encounter
Admission: RE | Disposition: A | Discharge status: Home / Self Care | Payer: Self-pay | Source: Ambulatory Visit | Attending: Unknown Physician Specialty

## 2017-07-18 ENCOUNTER — Encounter: Payer: Self-pay | Admitting: *Deleted

## 2017-07-18 ENCOUNTER — Ambulatory Visit
Admission: RE | Admit: 2017-07-18 | Discharge: 2017-07-18 | Disposition: A | Discharge status: Home / Self Care | Payer: Medicare HMO | Source: Ambulatory Visit | Attending: Unknown Physician Specialty | Admitting: Unknown Physician Specialty

## 2017-07-18 DIAGNOSIS — K5730 Diverticulosis of large intestine without perforation or abscess without bleeding: Secondary | ICD-10-CM | POA: Diagnosis not present

## 2017-07-18 DIAGNOSIS — I10 Essential (primary) hypertension: Secondary | ICD-10-CM | POA: Diagnosis not present

## 2017-07-18 DIAGNOSIS — Z7984 Long term (current) use of oral hypoglycemic drugs: Secondary | ICD-10-CM | POA: Insufficient documentation

## 2017-07-18 DIAGNOSIS — K648 Other hemorrhoids: Secondary | ICD-10-CM | POA: Diagnosis not present

## 2017-07-18 DIAGNOSIS — E119 Type 2 diabetes mellitus without complications: Secondary | ICD-10-CM | POA: Insufficient documentation

## 2017-07-18 DIAGNOSIS — Z87891 Personal history of nicotine dependence: Secondary | ICD-10-CM | POA: Diagnosis not present

## 2017-07-18 DIAGNOSIS — K219 Gastro-esophageal reflux disease without esophagitis: Secondary | ICD-10-CM | POA: Insufficient documentation

## 2017-07-18 DIAGNOSIS — D123 Benign neoplasm of transverse colon: Secondary | ICD-10-CM | POA: Insufficient documentation

## 2017-07-18 DIAGNOSIS — K5289 Other specified noninfective gastroenteritis and colitis: Secondary | ICD-10-CM | POA: Diagnosis not present

## 2017-07-18 DIAGNOSIS — Z79899 Other long term (current) drug therapy: Secondary | ICD-10-CM | POA: Diagnosis not present

## 2017-07-18 DIAGNOSIS — R197 Diarrhea, unspecified: Secondary | ICD-10-CM | POA: Diagnosis not present

## 2017-07-18 DIAGNOSIS — K529 Noninfective gastroenteritis and colitis, unspecified: Secondary | ICD-10-CM | POA: Insufficient documentation

## 2017-07-18 DIAGNOSIS — K5790 Diverticulosis of intestine, part unspecified, without perforation or abscess without bleeding: Secondary | ICD-10-CM | POA: Diagnosis not present

## 2017-07-18 DIAGNOSIS — E7800 Pure hypercholesterolemia, unspecified: Secondary | ICD-10-CM | POA: Insufficient documentation

## 2017-07-18 DIAGNOSIS — K640 First degree hemorrhoids: Secondary | ICD-10-CM | POA: Diagnosis not present

## 2017-07-18 DIAGNOSIS — K635 Polyp of colon: Secondary | ICD-10-CM | POA: Diagnosis not present

## 2017-07-18 HISTORY — PX: COLONOSCOPY WITH PROPOFOL: SHX5780

## 2017-07-18 LAB — GLUCOSE, CAPILLARY: Glucose-Capillary: 93 mg/dL (ref 65–99)

## 2017-07-18 SURGERY — COLONOSCOPY WITH PROPOFOL
Anesthesia: General

## 2017-07-18 MED ORDER — FENTANYL CITRATE (PF) 100 MCG/2ML IJ SOLN
INTRAMUSCULAR | Status: DC | PRN
  Administered 2017-07-18: 25 ug via INTRAVENOUS
  Administered 2017-07-18: 50 ug via INTRAVENOUS
  Administered 2017-07-18: 25 ug via INTRAVENOUS

## 2017-07-18 MED ORDER — LIDOCAINE HCL (PF) 2 % IJ SOLN
INTRAMUSCULAR | Status: AC
  Filled 2017-07-18: qty 10

## 2017-07-18 MED ORDER — SODIUM CHLORIDE 0.9 % IV SOLN: INTRAVENOUS | Status: DC

## 2017-07-18 MED ORDER — SODIUM CHLORIDE 0.9 % IV SOLN
INTRAVENOUS | Status: DC
  Administered 2017-07-18: 09:00:00 via INTRAVENOUS

## 2017-07-18 MED ORDER — MIDAZOLAM HCL 5 MG/5ML IJ SOLN
INTRAMUSCULAR | Status: DC | PRN
  Administered 2017-07-18 (×2): 1 mg via INTRAVENOUS

## 2017-07-18 MED ORDER — PROPOFOL 10 MG/ML IV BOLUS
INTRAVENOUS | Status: DC | PRN
  Administered 2017-07-18 (×2): 10 mg via INTRAVENOUS
  Administered 2017-07-18: 20 mg via INTRAVENOUS

## 2017-07-18 MED ORDER — PROPOFOL 500 MG/50ML IV EMUL
INTRAVENOUS | Status: AC
Start: 2017-07-18 — End: 2017-07-18
  Filled 2017-07-18: qty 50

## 2017-07-18 MED ORDER — PROPOFOL 500 MG/50ML IV EMUL
INTRAVENOUS | Status: DC | PRN
  Administered 2017-07-18: 50 ug/kg/min via INTRAVENOUS

## 2017-07-18 MED ORDER — FENTANYL CITRATE (PF) 100 MCG/2ML IJ SOLN
INTRAMUSCULAR | Status: AC
  Filled 2017-07-18: qty 2

## 2017-07-18 MED ORDER — MIDAZOLAM HCL 2 MG/2ML IJ SOLN
INTRAMUSCULAR | Status: AC
Start: 2017-07-18 — End: 2017-07-18
  Filled 2017-07-18: qty 2

## 2017-07-18 MED ORDER — LIDOCAINE HCL (PF) 2 % IJ SOLN
INTRAMUSCULAR | Status: DC | PRN
  Administered 2017-07-18: 60 mg

## 2017-07-18 NOTE — Anesthesia Preprocedure Evaluation (Signed)
Anesthesia Evaluation  Patient identified by MRN, date of birth, ID band Patient awake    Reviewed: Allergy & Precautions, H&P , NPO status , Patient's Chart, lab work & pertinent test results  History of Anesthesia Complications Negative for: history of anesthetic complications  Airway Mallampati: III  TM Distance: <3 FB Neck ROM: limited    Dental  (+) Poor Dentition, Missing, Edentulous Upper, Edentulous Lower, Upper Dentures, Lower Dentures   Pulmonary neg shortness of breath, former smoker,           Cardiovascular Exercise Tolerance: Good hypertension, (-) angina(-) Past MI and (-) DOE + Valvular Problems/Murmurs      Neuro/Psych  Neuromuscular disease negative psych ROS   GI/Hepatic negative GI ROS, Neg liver ROS, GERD  Medicated and Controlled,  Endo/Other  diabetes, Type 2  Renal/GU Renal diseasenegative Renal ROS  negative genitourinary   Musculoskeletal  (+) Arthritis ,   Abdominal   Peds  Hematology negative hematology ROS (+)   Anesthesia Other Findings Past Medical History: No date: Anemia, unspecified No date: Arthritis No date: Diabetes mellitus without complication ([facility]) No date: Disorder of bursae and tendons in shoulder region     Comment:  unspecified No date: Diverticulitis     Comment:  had a GI bleed with this in the past No date: Diverticulosis No date: Essential hypertension, benign No date: GERD (gastroesophageal reflux disease) No date: Heart murmur     Comment:  followed by PCP No date: History of GI bleed No date: Hypertension No date: Mitral valve disorder No date: Nephrolithiasis No date: Osteoporosis No date: Pure hypercholesterolemia No date: 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 ([facility])     Comment:  unspecified type diabetes without mention of               complication, not stated as uncontrolled  Past Surgical History: No date: ABDOMINAL  HYSTERECTOMY 10/08/2016: ANTERIOR THORACIC SPINE FUSION MULTIPLE LEVELS     Comment:  Procedure: ARTHRODESIS, ANTERIOR INTERBODY TECHNIQUE,               INCLUDING MINIMAL DISCECTOMY TO PREPARE INTERSPACE (OTHER              THAN FOR DECOMPRESSION); LUMBAR; Surgeon: Ricard Dillon, MD; Location: [place]; Service:               Neurosurgery; Laterality: N/A; 10/08/2016: ARTHRODESIS ANTEROR CERVICAL SPINE     Comment:  Procedure: ARTHRODESIS, ANTERIOR INTERBODY TECHNIQUE,               INCLUDING MINIMAL DISCECTOMY TO PREPARE INTERSPACE (OTHER              THAN DECOMPRESSION); EACH ADDITIONAL INTERSPACE (LIST IN               ADDITION TO CODE FOR PRIMARY PROCEDURE); Surgeon:               Ricard Dillon, MD; Location: [place];               Service: Neurosurgery; Laterality: N/A; 1970: BREAST EXCISIONAL BIOPSY; Left     Comment:  neg 10/18/2015: CATARACT EXTRACTION W/PHACO; Right     Comment:  Procedure: CATARACT EXTRACTION PHACO AND INTRAOCULAR               LENS PLACEMENT (IOC);  Surgeon: Leandrew Koyanagi, MD;  Location: [place];  Service: Ophthalmology;                Laterality: Right;  DIABETIC - oral meds KEEP PT TIME               AFTER 8 PER PT KEEP 5TH 2017: COLPOSCOPY     Comment:  vag colp-neg TVH 10/08/2016: INSTRUMENTATION POSTERIOR SPINE 3 TO 6 VERTERAL SEGMENTS     Comment:  Procedure: POSTERIOR SEGMENTAL INSTRUMENTATION (EG,               PEDICLE FIXATION, DUAL RODS WITH MULTIPLE HOOKS AND               SUBLAMINAR WIRES); 3 TO 6 VERTEBRAL SEGMENTS (LIST IN               ADDITION TO PRIMARY PROCEDURE); Surgeon: Ricard Dillon, MD; Location: [place]; Service:               Neurosurgery; Laterality: N/A; No date: PARTIAL HYSTERECTOMY     Comment:  in the 70's 10/08/2016: POSTERIOR LUMBAR SPINE FUSION ONE LEVEL LATERAL  TRANSVERSE TECHNIQUE     Comment:  Procedure: ARTHRODESIS,  POSTERIOR OR POSTEROLATERAL               TECHNIQUE, SINGLE LEVEL; LUMBAR (WITH LATERAL TRANSVERSE               TECHNIQUE, WHEN PERFORMED); Surgeon: Ricard Dillon, MD; Location: [place]; Service: Neurosurgery;               Laterality: N/A; No date: STRESS FRACTURE OF RIGHT FOOT No date: TOTAL VAGINAL HYSTERECTOMY  BMI    Body Mass Index:  25.70 kg/m      Reproductive/Obstetrics negative OB ROS                             Anesthesia Physical Anesthesia Plan  ASA: III  Anesthesia Plan: General   Post-op Pain Management:    Induction: Intravenous  PONV Risk Score and Plan: Propofol infusion and TIVA  Airway Management Planned: Natural Airway and Nasal Cannula  Additional Equipment:   Intra-op Plan:   Post-operative Plan:   Informed Consent: I have reviewed the patients History and Physical, chart, labs and discussed the procedure including the risks, benefits and alternatives for the proposed anesthesia with the patient or authorized representative who has indicated his/her understanding and acceptance.   Dental Advisory Given  Plan Discussed with: Anesthesiologist, CRNA and Surgeon  Anesthesia Plan Comments: (Patient consented for risks of anesthesia including but not limited to:  - adverse reactions to medications - risk of intubation if required - damage to teeth, lips or other oral mucosa - sore throat or hoarseness - Damage to heart, brain, lungs or loss of life  Patient voiced understanding.)        Anesthesia Quick Evaluation

## 2017-07-18 NOTE — Op Note (Signed)
[facility] 352 Gastroenterology Patient Name: Jordan Small Procedure Date: 07/18/2017 9:19 AM MRN: [id number] Account #: [id number] Date of Birth: 09-26-1940 Admit Type: Outpatient Age: 77 Room: [facility] ENDO ROOM 1 Gender: Female Note Status: Finalized Procedure:            Colonoscopy Indications:          Clinically significant diarrhea of unexplained origin Providers:            Manya Silvas, MD Referring MD:         Glendon Axe (Referring MD) Medicines:            Propofol per Anesthesia Complications:        No immediate complications. Procedure:            Pre-Anesthesia Assessment:                       - After reviewing the risks and benefits, the patient                        was deemed in satisfactory condition to undergo the                        procedure.                       After obtaining informed consent, the colonoscope was                        passed under direct vision. Throughout the procedure,                        the patient's blood pressure, pulse, and oxygen                        saturations were monitored continuously. The                        Colonoscope was introduced through the anus and                        advanced to the the cecum, identified by appendiceal                        orifice and ileocecal valve. The colonoscopy was                        performed without difficulty. The patient tolerated the                        procedure well. The quality of the bowel preparation                        was excellent. Findings:      A diminutive polyp was found in the transverse colon. The polyp was       sessile. The polyp was removed with a jumbo cold forceps. Resection and       retrieval were complete.      Internal hemorrhoids were found during endoscopy. The hemorrhoids were       small and Grade I (internal hemorrhoids that do not prolapse).      Multiple medium-mouthed diverticula  were found in the sigmoid  colon and       descending colon.      Biopsies done of ascending, transverse, descending and sigmoid colon due       to her diarrhea.      The exam was otherwise without abnormality. Impression:           - One diminutive polyp in the transverse colon, removed                        with a jumbo cold forceps. Resected and retrieved.                       - Internal hemorrhoids.                       - Diverticulosis in the sigmoid colon and in the                        descending colon.                       - The examination was otherwise normal. Recommendation:       - Await pathology results. Manya Silvas, MD 07/18/2017 9:49:55 AM This report has been signed electronically. Number of Addenda: 0 Note Initiated On: 07/18/2017 9:19 AM Scope Withdrawal Time: 0 hours 8 minutes 3 seconds  Total Procedure Duration: 0 hours 18 minutes 42 seconds       [facility] [place]

## 2017-07-18 NOTE — Transfer of Care (Signed)
Immediate Anesthesia Transfer of Care Note  Patient: Jordan Small  Procedure(s) Performed: COLONOSCOPY WITH PROPOFOL (N/A )  Patient Location: PACU  Anesthesia Type:General  Level of Consciousness: sedated  Airway & Oxygen Therapy: Patient Spontanous Breathing and Patient connected to nasal cannula oxygen  Post-op Assessment: Report given to RN and Post -op Vital signs reviewed and stable  Post vital signs: Reviewed and stable  Last Vitals:  Vitals Value Taken Time  BP 115/60 07/18/2017  9:49 AM  Temp    Pulse 70 07/18/2017  9:50 AM  Resp 16 07/18/2017  9:50 AM  SpO2 99 % 07/18/2017  9:50 AM  Vitals shown include unvalidated device data.  Last Pain:  Vitals:   07/18/17 0848  TempSrc: Tympanic  PainSc: 0-No pain         Complications: No apparent anesthesia complications

## 2017-07-18 NOTE — Anesthesia Postprocedure Evaluation (Signed)
Anesthesia Post Note  Patient: Jordan Small  Procedure(s) Performed: COLONOSCOPY WITH PROPOFOL (N/A )  Patient location during evaluation: Endoscopy Anesthesia Type: General Level of consciousness: awake and alert Pain management: pain level controlled Vital Signs Assessment: post-procedure vital signs reviewed and stable Respiratory status: spontaneous breathing, nonlabored ventilation, respiratory function stable and patient connected to nasal cannula oxygen Cardiovascular status: blood pressure returned to baseline and stable Postop Assessment: no apparent nausea or vomiting Anesthetic complications: no     Last Vitals:  Vitals:   07/18/17 1000 07/18/17 1020  BP: 122/70 124/65  Pulse: 84 69  Resp: 16 19  Temp:    SpO2: 99% 100%    Last Pain:  Vitals:   07/18/17 1020  TempSrc:   PainSc: 0-No pain                 Precious Haws Piscitello

## 2017-07-18 NOTE — Anesthesia Post-op Follow-up Note (Signed)
Anesthesia QCDR form completed.        

## 2017-07-18 NOTE — H&P (Signed)
Primary Care Physician:  Glendon Axe, MD Primary Gastroenterologist:  Dr. Vira Agar  Pre-Procedure History & Physical: HPI:  Jordan Small is a 77 y.o. female is here for an colonoscopy.  Done for diarrhea.   Past Medical History:  Diagnosis Date  . Anemia, unspecified   . Arthritis   . Diabetes mellitus without complication ([place])   . Disorder of bursae and tendons in shoulder region    unspecified  . Diverticulitis    had a GI bleed with this in the past  . Diverticulosis   . Essential hypertension, benign   . GERD (gastroesophageal reflux disease)   . Heart murmur    followed by PCP  . History of GI bleed   . Hypertension   . Mitral valve disorder   . Nephrolithiasis   . Osteoporosis   . Pure hypercholesterolemia   . 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 ([place])    unspecified type diabetes without mention of complication, not stated as uncontrolled    Past Surgical History:  Procedure Laterality Date  . ABDOMINAL HYSTERECTOMY    . ANTERIOR THORACIC SPINE FUSION MULTIPLE LEVELS  10/08/2016   Procedure: ARTHRODESIS, ANTERIOR INTERBODY TECHNIQUE, INCLUDING MINIMAL DISCECTOMY TO PREPARE INTERSPACE (OTHER THAN FOR DECOMPRESSION); LUMBAR; Surgeon: Ricard Dillon, MD; Location: [facility]; Service: Neurosurgery; Laterality: N/A;  . ARTHRODESIS ANTEROR CERVICAL SPINE  10/08/2016   Procedure: ARTHRODESIS, ANTERIOR INTERBODY TECHNIQUE, INCLUDING MINIMAL DISCECTOMY TO PREPARE INTERSPACE (OTHER THAN DECOMPRESSION); EACH ADDITIONAL INTERSPACE (LIST IN ADDITION TO CODE FOR PRIMARY PROCEDURE); Surgeon: Ricard Dillon, MD; Location: [facility]; Service: Neurosurgery; Laterality: N/A;  . BREAST EXCISIONAL BIOPSY Left 1970   neg  . CATARACT EXTRACTION W/PHACO Right 10/18/2015   Procedure: CATARACT EXTRACTION PHACO AND INTRAOCULAR LENS PLACEMENT (IOC);  Surgeon: Leandrew Koyanagi, MD;  Location: [facility];  Service: Ophthalmology;  Laterality:  Right;  DIABETIC - oral meds KEEP PT TIME AFTER 8 PER PT KEEP 5TH  . COLPOSCOPY  2017   vag colp-neg [facility]  . INSTRUMENTATION POSTERIOR SPINE 3 TO 6 VERTERAL SEGMENTS  10/08/2016   Procedure: POSTERIOR SEGMENTAL INSTRUMENTATION (EG, PEDICLE FIXATION, DUAL RODS WITH MULTIPLE HOOKS AND SUBLAMINAR WIRES); 3 TO 6 VERTEBRAL SEGMENTS (LIST IN ADDITION TO PRIMARY PROCEDURE); Surgeon: Ricard Dillon, MD; Location: [facility]; Service: Neurosurgery; Laterality: N/A;  . PARTIAL HYSTERECTOMY     in the 70's  . POSTERIOR LUMBAR SPINE FUSION ONE LEVEL LATERAL TRANSVERSE TECHNIQUE  10/08/2016   Procedure: ARTHRODESIS, POSTERIOR OR POSTEROLATERAL TECHNIQUE, SINGLE LEVEL; LUMBAR (WITH LATERAL TRANSVERSE TECHNIQUE, WHEN PERFORMED); Surgeon: Ricard Dillon, MD; Location: [facility]; Service: Neurosurgery; Laterality: N/A;  . STRESS FRACTURE OF RIGHT FOOT    . TOTAL VAGINAL HYSTERECTOMY      Prior to Admission medications   Medication Sig Start Date End Date Taking? Authorizing Provider  acetaminophen (TYLENOL) 325 MG tablet Take 650 mg by mouth every 4 (four) hours as needed. For pain or increased fever. Max dose for 24 hr is 3000 mg from all sources of Apap/tylenol   Yes [provider]  alendronate (FOSAMAX) 35 MG tablet Take 35 mg by mouth every 7 (seven) days. On Fridays. Take with a full glass of water on an empty stomach.   Yes [provider]  amLODipine (NORVASC) 2.5 MG tablet Take 2.5 mg by mouth 2 (two) times daily.   Yes [provider]  atorvastatin (LIPITOR) 40 MG tablet Take 40 mg by mouth daily.   Yes [provider]  calcium carbonate (  OSCAL) 1500 (600 Ca) MG TABS tablet Take by mouth 2 (two) times daily with a meal.   Yes [provider]  ferrous gluconate (FERGON) 324 MG tablet Take 324 mg by mouth 2 (two) times daily with a meal.   Yes [provider]  glipiZIDE (GLUCOTROL) 10 MG tablet Take 10 mg by mouth daily before  breakfast.   Yes [provider]  glipiZIDE (GLUCOTROL) 10 MG tablet Take 5 mg by mouth at bedtime. 1/2 tablet   Yes [provider]  hydrochlorothiazide (HYDRODIURIL) 25 MG tablet Take 25 mg by mouth daily.    Yes [provider]  lisinopril (PRINIVIL,ZESTRIL) 40 MG tablet Take 40 mg by mouth daily.    Yes [provider]  metformin (FORTAMET) 1000 MG (OSM) 24 hr tablet Take 1,000 mg by mouth daily at 6 PM.    Yes [provider]  metFORMIN (GLUCOPHAGE) 500 MG tablet Take 500 mg by mouth daily with breakfast.   Yes [provider]  metoprolol succinate (TOPROL-XL) 25 MG 24 hr tablet Take 25 mg by mouth daily.   Yes [provider]  promethazine (PHENERGAN) 25 MG tablet Take 12.5-25 mg by mouth every 8 (eight) hours as needed for nausea or vomiting. 1/2 - 1 tablet   Yes [provider]  sitaGLIPtin (JANUVIA) 100 MG tablet Take 100 mg by mouth daily.    Yes [provider]  timolol (TIMOPTIC) 0.25 % ophthalmic solution Place 1 drop into the right eye 2 (two) times daily.   Yes [provider]  traMADol (ULTRAM) 50 MG tablet Take 1-2 tablets (50-100 mg total) by mouth every 4 (four) hours as needed for moderate pain or severe pain. 1 tablet for moderate pain and 2 tablets for severe pain 10/24/16  Yes Toni Arthurs, NP  vitamin C (ASCORBIC ACID) 500 MG tablet Take 500 mg by mouth 2 (two) times daily.   Yes [provider]    Allergies as of 06/11/2017 - Review Complete 10/28/2016  Allergen Reaction Noted  . Shellfish allergy Nausea And Vomiting 10/05/2015    Family History  Problem Relation Age of Onset  . Thyroid disease Mother   . Pulmonary embolism Father   . Diabetes Sister   . Hypertension Sister   . Lupus Sister   . Coronary artery disease Other        uncle    Social History   Socioeconomic History  . Marital status: Divorced    Spouse name: Not on file  . Number of children: 3   . Years of education: college  . Highest education level: Not on file  Occupational History    Comment: retired Community education officer  Social Needs  . Financial resource strain: Not on file  . Food insecurity:    Worry: Not on file    Inability: Not on file  . Transportation needs:    Medical: Not on file    Non-medical: Not on file  Tobacco Use  . Smoking status: Former Smoker    Packs/day: 2.00    Years: 20.00    Pack years: 40.00    Types: Cigarettes    Last attempt to quit: 1985    Years since quitting: 34.4  . Smokeless tobacco: Never Used  Substance and Sexual Activity  . Alcohol use: Yes    Alcohol/week: 1.2 oz    Types: 2 Glasses of wine per week    Comment: socially  . Drug use: Never  . Sexual activity:  Not on file  Lifestyle  . Physical activity:    Days per week: Not on file    Minutes per session: Not on file  . Stress: Not on file  Relationships  . Social connections:    Talks on phone: Not on file    Gets together: Not on file    Attends religious service: Not on file    Active member of club or organization: Not on file    Attends meetings of clubs or organizations: Not on file    Relationship status: Not on file  . Intimate partner violence:    Fear of current or ex partner: Not on file    Emotionally abused: Not on file    Physically abused: Not on file    Forced sexual activity: Not on file  Other Topics Concern  . Not on file  Social History Narrative   Former smoker   Drinks 2 glasses of wine socially   3 sons   Divorced   DNR    Review of Systems: See HPI, otherwise negative ROS  Physical Exam: BP (!) 151/80   Pulse 80   Temp (!) 97.5 F (36.4 C) (Tympanic)   Resp 18   Ht 5\' 1"  (1.549 m)   Wt 61.7 kg (136 lb)   SpO2 100%   BMI 25.70 kg/m  General:   Alert,  pleasant and cooperative in NAD Head:  Normocephalic and atraumatic. Neck:  Supple; no masses or thyromegaly. Lungs:  Clear throughout to auscultation.    Heart:   Regular rate and rhythm. Abdomen:  Soft, nontender and nondistended. Normal bowel sounds, without guarding, and without rebound.   Neurologic:  Alert and  oriented x4;  grossly normal neurologically.  Impression/Plan: Jordan Small is here for an colonoscopy to be performed for diarrhea.  Risks, benefits, limitations, and alternatives regarding  colonoscopy have been reviewed with the patient.  Questions have been answered.  All parties agreeable.   Gaylyn Cheers, MD  07/18/2017, 9:20 AM

## 2017-07-21 ENCOUNTER — Encounter: Payer: Self-pay | Admitting: Unknown Physician Specialty

## 2017-07-21 LAB — SURGICAL PATHOLOGY

## 2017-07-22 ENCOUNTER — Ambulatory Visit
Admission: RE | Admit: 2017-07-22 | Discharge: 2017-07-22 | Disposition: A | Discharge status: Home / Self Care | Payer: Medicare HMO | Source: Ambulatory Visit | Attending: Internal Medicine | Admitting: Internal Medicine

## 2017-07-22 DIAGNOSIS — Z1231 Encounter for screening mammogram for malignant neoplasm of breast: Secondary | ICD-10-CM | POA: Insufficient documentation

## 2017-07-22 DIAGNOSIS — Z1239 Encounter for other screening for malignant neoplasm of breast: Secondary | ICD-10-CM

## 2017-07-24 DIAGNOSIS — N890 Mild vaginal dysplasia: Secondary | ICD-10-CM | POA: Diagnosis not present

## 2017-08-15 DIAGNOSIS — E538 Deficiency of other specified B group vitamins: Secondary | ICD-10-CM | POA: Diagnosis not present

## 2017-09-04 DIAGNOSIS — H401111 Primary open-angle glaucoma, right eye, mild stage: Secondary | ICD-10-CM | POA: Diagnosis not present

## 2017-09-15 DIAGNOSIS — E538 Deficiency of other specified B group vitamins: Secondary | ICD-10-CM | POA: Diagnosis not present

## 2017-09-25 DIAGNOSIS — Z981 Arthrodesis status: Secondary | ICD-10-CM | POA: Diagnosis not present

## 2017-09-25 DIAGNOSIS — M4326 Fusion of spine, lumbar region: Secondary | ICD-10-CM | POA: Diagnosis not present

## 2017-09-25 DIAGNOSIS — M48062 Spinal stenosis, lumbar region with neurogenic claudication: Secondary | ICD-10-CM | POA: Diagnosis not present

## 2017-09-26 ENCOUNTER — Other Ambulatory Visit: Payer: Self-pay | Admitting: Neurosurgery

## 2017-09-26 DIAGNOSIS — G9519 Other vascular myelopathies: Secondary | ICD-10-CM

## 2017-09-26 DIAGNOSIS — M48062 Spinal stenosis, lumbar region with neurogenic claudication: Secondary | ICD-10-CM

## 2017-10-13 ENCOUNTER — Ambulatory Visit
Admission: RE | Admit: 2017-10-13 | Discharge: 2017-10-13 | Disposition: A | Discharge status: Home / Self Care | Payer: Medicare HMO | Source: Ambulatory Visit | Attending: Neurosurgery | Admitting: Neurosurgery

## 2017-10-13 DIAGNOSIS — M48062 Spinal stenosis, lumbar region with neurogenic claudication: Secondary | ICD-10-CM | POA: Insufficient documentation

## 2017-10-13 DIAGNOSIS — M5137 Other intervertebral disc degeneration, lumbosacral region: Secondary | ICD-10-CM | POA: Insufficient documentation

## 2017-10-13 DIAGNOSIS — G9519 Other vascular myelopathies: Secondary | ICD-10-CM

## 2017-10-13 DIAGNOSIS — M5125 Other intervertebral disc displacement, thoracolumbar region: Secondary | ICD-10-CM | POA: Diagnosis not present

## 2017-10-13 DIAGNOSIS — M5136 Other intervertebral disc degeneration, lumbar region: Secondary | ICD-10-CM | POA: Diagnosis not present

## 2017-10-13 DIAGNOSIS — M4316 Spondylolisthesis, lumbar region: Secondary | ICD-10-CM | POA: Diagnosis not present

## 2017-10-13 DIAGNOSIS — Z981 Arthrodesis status: Secondary | ICD-10-CM | POA: Insufficient documentation

## 2017-10-13 DIAGNOSIS — M48061 Spinal stenosis, lumbar region without neurogenic claudication: Secondary | ICD-10-CM | POA: Insufficient documentation

## 2017-10-13 DIAGNOSIS — M79605 Pain in left leg: Secondary | ICD-10-CM | POA: Diagnosis not present

## 2017-10-16 DIAGNOSIS — E538 Deficiency of other specified B group vitamins: Secondary | ICD-10-CM | POA: Diagnosis not present

## 2017-10-23 DIAGNOSIS — M5416 Radiculopathy, lumbar region: Secondary | ICD-10-CM | POA: Diagnosis not present

## 2017-11-05 DIAGNOSIS — M5416 Radiculopathy, lumbar region: Secondary | ICD-10-CM | POA: Diagnosis not present

## 2017-11-17 DIAGNOSIS — M5416 Radiculopathy, lumbar region: Secondary | ICD-10-CM | POA: Diagnosis not present

## 2017-11-17 DIAGNOSIS — E538 Deficiency of other specified B group vitamins: Secondary | ICD-10-CM | POA: Diagnosis not present

## 2017-11-17 DIAGNOSIS — Z23 Encounter for immunization: Secondary | ICD-10-CM | POA: Diagnosis not present

## 2017-11-24 DIAGNOSIS — M5416 Radiculopathy, lumbar region: Secondary | ICD-10-CM | POA: Diagnosis not present

## 2017-12-04 DIAGNOSIS — M5416 Radiculopathy, lumbar region: Secondary | ICD-10-CM | POA: Diagnosis not present

## 2017-12-04 DIAGNOSIS — M5136 Other intervertebral disc degeneration, lumbar region: Secondary | ICD-10-CM | POA: Diagnosis not present

## 2017-12-08 DIAGNOSIS — R809 Proteinuria, unspecified: Secondary | ICD-10-CM | POA: Diagnosis not present

## 2017-12-08 DIAGNOSIS — E1129 Type 2 diabetes mellitus with other diabetic kidney complication: Secondary | ICD-10-CM | POA: Diagnosis not present

## 2017-12-08 DIAGNOSIS — E7800 Pure hypercholesterolemia, unspecified: Secondary | ICD-10-CM | POA: Diagnosis not present

## 2017-12-15 DIAGNOSIS — I10 Essential (primary) hypertension: Secondary | ICD-10-CM | POA: Diagnosis not present

## 2017-12-15 DIAGNOSIS — R809 Proteinuria, unspecified: Secondary | ICD-10-CM | POA: Diagnosis not present

## 2017-12-15 DIAGNOSIS — E7800 Pure hypercholesterolemia, unspecified: Secondary | ICD-10-CM | POA: Diagnosis not present

## 2017-12-15 DIAGNOSIS — E1129 Type 2 diabetes mellitus with other diabetic kidney complication: Secondary | ICD-10-CM | POA: Diagnosis not present

## 2017-12-15 DIAGNOSIS — E538 Deficiency of other specified B group vitamins: Secondary | ICD-10-CM | POA: Diagnosis not present

## 2017-12-18 DIAGNOSIS — E538 Deficiency of other specified B group vitamins: Secondary | ICD-10-CM | POA: Diagnosis not present

## 2018-01-20 DIAGNOSIS — E538 Deficiency of other specified B group vitamins: Secondary | ICD-10-CM | POA: Diagnosis not present

## 2018-02-06 DIAGNOSIS — M5136 Other intervertebral disc degeneration, lumbar region: Secondary | ICD-10-CM | POA: Diagnosis not present

## 2018-02-06 DIAGNOSIS — M5416 Radiculopathy, lumbar region: Secondary | ICD-10-CM | POA: Diagnosis not present

## 2018-02-20 DIAGNOSIS — E538 Deficiency of other specified B group vitamins: Secondary | ICD-10-CM | POA: Diagnosis not present

## 2018-03-09 DIAGNOSIS — H401111 Primary open-angle glaucoma, right eye, mild stage: Secondary | ICD-10-CM | POA: Diagnosis not present

## 2018-03-12 DIAGNOSIS — E7800 Pure hypercholesterolemia, unspecified: Secondary | ICD-10-CM | POA: Diagnosis not present

## 2018-03-12 DIAGNOSIS — R809 Proteinuria, unspecified: Secondary | ICD-10-CM | POA: Diagnosis not present

## 2018-03-12 DIAGNOSIS — E1129 Type 2 diabetes mellitus with other diabetic kidney complication: Secondary | ICD-10-CM | POA: Diagnosis not present

## 2018-03-12 DIAGNOSIS — I10 Essential (primary) hypertension: Secondary | ICD-10-CM | POA: Diagnosis not present

## 2018-03-16 DIAGNOSIS — E119 Type 2 diabetes mellitus without complications: Secondary | ICD-10-CM | POA: Diagnosis not present

## 2018-03-17 DIAGNOSIS — N289 Disorder of kidney and ureter, unspecified: Secondary | ICD-10-CM | POA: Diagnosis not present

## 2018-03-17 DIAGNOSIS — M5416 Radiculopathy, lumbar region: Secondary | ICD-10-CM | POA: Diagnosis not present

## 2018-03-18 ENCOUNTER — Other Ambulatory Visit (HOSPITAL_COMMUNITY): Payer: Self-pay | Admitting: Internal Medicine

## 2018-03-18 ENCOUNTER — Other Ambulatory Visit: Payer: Self-pay | Admitting: Internal Medicine

## 2018-03-18 DIAGNOSIS — N289 Disorder of kidney and ureter, unspecified: Secondary | ICD-10-CM

## 2018-03-23 DIAGNOSIS — E538 Deficiency of other specified B group vitamins: Secondary | ICD-10-CM | POA: Diagnosis not present

## 2018-03-24 ENCOUNTER — Ambulatory Visit
Admission: RE | Admit: 2018-03-24 | Discharge: 2018-03-24 | Disposition: A | Discharge status: Home / Self Care | Payer: Medicare HMO | Source: Ambulatory Visit | Attending: Internal Medicine | Admitting: Internal Medicine

## 2018-03-24 DIAGNOSIS — N281 Cyst of kidney, acquired: Secondary | ICD-10-CM | POA: Diagnosis not present

## 2018-03-24 DIAGNOSIS — N179 Acute kidney failure, unspecified: Secondary | ICD-10-CM | POA: Diagnosis not present

## 2018-03-24 DIAGNOSIS — N289 Disorder of kidney and ureter, unspecified: Secondary | ICD-10-CM | POA: Insufficient documentation

## 2018-04-08 DIAGNOSIS — N289 Disorder of kidney and ureter, unspecified: Secondary | ICD-10-CM | POA: Diagnosis not present

## 2018-04-15 DIAGNOSIS — E1129 Type 2 diabetes mellitus with other diabetic kidney complication: Secondary | ICD-10-CM | POA: Diagnosis not present

## 2018-04-15 DIAGNOSIS — N183 Chronic kidney disease, stage 3 (moderate): Secondary | ICD-10-CM | POA: Diagnosis not present

## 2018-04-15 DIAGNOSIS — M5416 Radiculopathy, lumbar region: Secondary | ICD-10-CM | POA: Diagnosis not present

## 2018-04-15 DIAGNOSIS — R809 Proteinuria, unspecified: Secondary | ICD-10-CM | POA: Diagnosis not present

## 2018-04-23 DIAGNOSIS — E538 Deficiency of other specified B group vitamins: Secondary | ICD-10-CM | POA: Diagnosis not present

## 2018-05-25 DIAGNOSIS — E538 Deficiency of other specified B group vitamins: Secondary | ICD-10-CM | POA: Diagnosis not present

## 2018-06-11 DIAGNOSIS — N183 Chronic kidney disease, stage 3 (moderate): Secondary | ICD-10-CM | POA: Diagnosis not present

## 2018-06-18 DIAGNOSIS — R87612 Low grade squamous intraepithelial lesion on cytologic smear of cervix (LGSIL): Secondary | ICD-10-CM | POA: Diagnosis not present

## 2018-06-18 DIAGNOSIS — E7800 Pure hypercholesterolemia, unspecified: Secondary | ICD-10-CM | POA: Diagnosis not present

## 2018-06-18 DIAGNOSIS — Z23 Encounter for immunization: Secondary | ICD-10-CM | POA: Diagnosis not present

## 2018-06-18 DIAGNOSIS — Z0000 Encounter for general adult medical examination without abnormal findings: Secondary | ICD-10-CM | POA: Diagnosis not present

## 2018-06-18 DIAGNOSIS — M810 Age-related osteoporosis without current pathological fracture: Secondary | ICD-10-CM | POA: Diagnosis not present

## 2018-06-18 DIAGNOSIS — Z1239 Encounter for other screening for malignant neoplasm of breast: Secondary | ICD-10-CM | POA: Diagnosis not present

## 2018-06-18 DIAGNOSIS — N182 Chronic kidney disease, stage 2 (mild): Secondary | ICD-10-CM | POA: Diagnosis not present

## 2018-06-19 ENCOUNTER — Other Ambulatory Visit: Payer: Self-pay | Admitting: Internal Medicine

## 2018-06-19 DIAGNOSIS — Z1231 Encounter for screening mammogram for malignant neoplasm of breast: Secondary | ICD-10-CM

## 2018-06-25 DIAGNOSIS — E538 Deficiency of other specified B group vitamins: Secondary | ICD-10-CM | POA: Diagnosis not present

## 2018-06-29 DIAGNOSIS — R87625 Unsatisfactory cytologic smear of vagina: Secondary | ICD-10-CM | POA: Diagnosis not present

## 2018-06-29 DIAGNOSIS — R87615 Unsatisfactory cytologic smear of cervix: Secondary | ICD-10-CM | POA: Diagnosis not present

## 2018-06-29 DIAGNOSIS — R87622 Low grade squamous intraepithelial lesion on cytologic smear of vagina (LGSIL): Secondary | ICD-10-CM | POA: Diagnosis not present

## 2018-07-27 DIAGNOSIS — E538 Deficiency of other specified B group vitamins: Secondary | ICD-10-CM | POA: Diagnosis not present

## 2018-07-29 DIAGNOSIS — N870 Mild cervical dysplasia: Secondary | ICD-10-CM | POA: Diagnosis not present

## 2018-07-29 DIAGNOSIS — R87622 Low grade squamous intraepithelial lesion on cytologic smear of vagina (LGSIL): Secondary | ICD-10-CM | POA: Diagnosis not present

## 2018-07-29 DIAGNOSIS — R87612 Low grade squamous intraepithelial lesion on cytologic smear of cervix (LGSIL): Secondary | ICD-10-CM | POA: Diagnosis not present

## 2018-08-12 DIAGNOSIS — Z8639 Personal history of other endocrine, nutritional and metabolic disease: Secondary | ICD-10-CM | POA: Diagnosis not present

## 2018-08-12 DIAGNOSIS — E538 Deficiency of other specified B group vitamins: Secondary | ICD-10-CM | POA: Diagnosis not present

## 2018-08-12 DIAGNOSIS — R809 Proteinuria, unspecified: Secondary | ICD-10-CM | POA: Diagnosis not present

## 2018-08-12 DIAGNOSIS — N183 Chronic kidney disease, stage 3 (moderate): Secondary | ICD-10-CM | POA: Diagnosis not present

## 2018-08-12 DIAGNOSIS — E1129 Type 2 diabetes mellitus with other diabetic kidney complication: Secondary | ICD-10-CM | POA: Diagnosis not present

## 2018-08-12 DIAGNOSIS — Z0000 Encounter for general adult medical examination without abnormal findings: Secondary | ICD-10-CM | POA: Diagnosis not present

## 2018-08-19 DIAGNOSIS — E1129 Type 2 diabetes mellitus with other diabetic kidney complication: Secondary | ICD-10-CM | POA: Diagnosis not present

## 2018-08-19 DIAGNOSIS — R809 Proteinuria, unspecified: Secondary | ICD-10-CM | POA: Diagnosis not present

## 2018-08-19 DIAGNOSIS — D649 Anemia, unspecified: Secondary | ICD-10-CM | POA: Diagnosis not present

## 2018-08-19 DIAGNOSIS — H409 Unspecified glaucoma: Secondary | ICD-10-CM | POA: Diagnosis not present

## 2018-08-19 DIAGNOSIS — M79605 Pain in left leg: Secondary | ICD-10-CM | POA: Diagnosis not present

## 2018-08-19 DIAGNOSIS — M810 Age-related osteoporosis without current pathological fracture: Secondary | ICD-10-CM | POA: Diagnosis not present

## 2018-08-19 DIAGNOSIS — I10 Essential (primary) hypertension: Secondary | ICD-10-CM | POA: Diagnosis not present

## 2018-08-19 DIAGNOSIS — Z0000 Encounter for general adult medical examination without abnormal findings: Secondary | ICD-10-CM | POA: Diagnosis not present

## 2018-08-26 ENCOUNTER — Ambulatory Visit
Admission: RE | Admit: 2018-08-26 | Discharge: 2018-08-26 | Disposition: A | Discharge status: Home / Self Care | Payer: Medicare HMO | Source: Ambulatory Visit | Attending: Internal Medicine | Admitting: Internal Medicine

## 2018-08-26 DIAGNOSIS — Z1231 Encounter for screening mammogram for malignant neoplasm of breast: Secondary | ICD-10-CM | POA: Diagnosis not present

## 2018-08-27 DIAGNOSIS — E538 Deficiency of other specified B group vitamins: Secondary | ICD-10-CM | POA: Diagnosis not present

## 2018-09-15 DIAGNOSIS — H401111 Primary open-angle glaucoma, right eye, mild stage: Secondary | ICD-10-CM | POA: Diagnosis not present

## 2018-09-28 DIAGNOSIS — E538 Deficiency of other specified B group vitamins: Secondary | ICD-10-CM | POA: Diagnosis not present

## 2018-10-29 DIAGNOSIS — E538 Deficiency of other specified B group vitamins: Secondary | ICD-10-CM | POA: Diagnosis not present

## 2018-11-06 DIAGNOSIS — Z23 Encounter for immunization: Secondary | ICD-10-CM | POA: Diagnosis not present

## 2018-11-30 DIAGNOSIS — E538 Deficiency of other specified B group vitamins: Secondary | ICD-10-CM | POA: Diagnosis not present

## 2018-12-08 NOTE — Telephone Encounter (Signed)
error 

## 2018-12-14 DIAGNOSIS — I10 Essential (primary) hypertension: Secondary | ICD-10-CM | POA: Diagnosis not present

## 2018-12-14 DIAGNOSIS — D649 Anemia, unspecified: Secondary | ICD-10-CM | POA: Diagnosis not present

## 2018-12-14 DIAGNOSIS — M810 Age-related osteoporosis without current pathological fracture: Secondary | ICD-10-CM | POA: Diagnosis not present

## 2018-12-14 DIAGNOSIS — M5136 Other intervertebral disc degeneration, lumbar region: Secondary | ICD-10-CM | POA: Diagnosis not present

## 2018-12-14 DIAGNOSIS — H409 Unspecified glaucoma: Secondary | ICD-10-CM | POA: Diagnosis not present

## 2018-12-14 DIAGNOSIS — M79605 Pain in left leg: Secondary | ICD-10-CM | POA: Diagnosis not present

## 2018-12-14 DIAGNOSIS — Z7689 Persons encountering health services in other specified circumstances: Secondary | ICD-10-CM | POA: Diagnosis not present

## 2018-12-14 DIAGNOSIS — R809 Proteinuria, unspecified: Secondary | ICD-10-CM | POA: Diagnosis not present

## 2018-12-14 DIAGNOSIS — E1129 Type 2 diabetes mellitus with other diabetic kidney complication: Secondary | ICD-10-CM | POA: Diagnosis not present

## 2018-12-21 DIAGNOSIS — I10 Essential (primary) hypertension: Secondary | ICD-10-CM | POA: Diagnosis not present

## 2018-12-21 DIAGNOSIS — D649 Anemia, unspecified: Secondary | ICD-10-CM | POA: Diagnosis not present

## 2018-12-21 DIAGNOSIS — Z87891 Personal history of nicotine dependence: Secondary | ICD-10-CM | POA: Diagnosis not present

## 2018-12-21 DIAGNOSIS — M810 Age-related osteoporosis without current pathological fracture: Secondary | ICD-10-CM | POA: Diagnosis not present

## 2018-12-21 DIAGNOSIS — M5136 Other intervertebral disc degeneration, lumbar region: Secondary | ICD-10-CM | POA: Diagnosis not present

## 2018-12-21 DIAGNOSIS — E785 Hyperlipidemia, unspecified: Secondary | ICD-10-CM | POA: Diagnosis not present

## 2018-12-21 DIAGNOSIS — E119 Type 2 diabetes mellitus without complications: Secondary | ICD-10-CM | POA: Diagnosis not present

## 2018-12-21 DIAGNOSIS — M79605 Pain in left leg: Secondary | ICD-10-CM | POA: Diagnosis not present

## 2018-12-31 DIAGNOSIS — E538 Deficiency of other specified B group vitamins: Secondary | ICD-10-CM | POA: Diagnosis not present

## 2019-02-01 DIAGNOSIS — E538 Deficiency of other specified B group vitamins: Secondary | ICD-10-CM | POA: Diagnosis not present

## 2019-03-04 DIAGNOSIS — E538 Deficiency of other specified B group vitamins: Secondary | ICD-10-CM | POA: Diagnosis not present

## 2019-03-16 DIAGNOSIS — H401111 Primary open-angle glaucoma, right eye, mild stage: Secondary | ICD-10-CM | POA: Diagnosis not present

## 2019-03-23 DIAGNOSIS — H401111 Primary open-angle glaucoma, right eye, mild stage: Secondary | ICD-10-CM | POA: Diagnosis not present

## 2019-04-02 ENCOUNTER — Other Ambulatory Visit: Payer: Self-pay

## 2019-04-02 ENCOUNTER — Ambulatory Visit: Payer: Medicare HMO | Attending: Internal Medicine

## 2019-04-02 DIAGNOSIS — Z23 Encounter for immunization: Secondary | ICD-10-CM

## 2019-04-02 NOTE — Progress Notes (Signed)
   Covid-19 Vaccination Clinic  Name:  Jordan Small    MRN: [id number] DOB: 1940-09-18  04/02/2019  [name] was observed post Covid-19 immunization for 15 minutes without incidence. She was provided with Vaccine Information Sheet and instruction to access the V-Safe system.   [name] was instructed to call 911 with any severe reactions post vaccine: Marland Kitchen Difficulty breathing  . Swelling of your face and throat  . A fast heartbeat  . A bad rash all over your body  . Dizziness and weakness    Immunizations Administered    Name Date Dose VIS Date Route   Pfizer COVID-19 Vaccine 04/02/2019 12:49 PM 0.3 mL 01/29/2019 Intramuscular   Manufacturer: [place]   Lot: [id number]   [place]: [id number]

## 2019-04-05 DIAGNOSIS — E538 Deficiency of other specified B group vitamins: Secondary | ICD-10-CM | POA: Diagnosis not present

## 2019-04-14 DIAGNOSIS — R809 Proteinuria, unspecified: Secondary | ICD-10-CM | POA: Diagnosis not present

## 2019-04-14 DIAGNOSIS — D519 Vitamin B12 deficiency anemia, unspecified: Secondary | ICD-10-CM | POA: Diagnosis not present

## 2019-04-14 DIAGNOSIS — R195 Other fecal abnormalities: Secondary | ICD-10-CM | POA: Diagnosis not present

## 2019-04-14 DIAGNOSIS — E1129 Type 2 diabetes mellitus with other diabetic kidney complication: Secondary | ICD-10-CM | POA: Diagnosis not present

## 2019-04-14 DIAGNOSIS — M5136 Other intervertebral disc degeneration, lumbar region: Secondary | ICD-10-CM | POA: Diagnosis not present

## 2019-04-14 DIAGNOSIS — I350 Nonrheumatic aortic (valve) stenosis: Secondary | ICD-10-CM | POA: Diagnosis not present

## 2019-04-14 DIAGNOSIS — E785 Hyperlipidemia, unspecified: Secondary | ICD-10-CM | POA: Diagnosis not present

## 2019-04-14 DIAGNOSIS — M79605 Pain in left leg: Secondary | ICD-10-CM | POA: Diagnosis not present

## 2019-04-21 DIAGNOSIS — E785 Hyperlipidemia, unspecified: Secondary | ICD-10-CM | POA: Diagnosis not present

## 2019-04-21 DIAGNOSIS — Z79899 Other long term (current) drug therapy: Secondary | ICD-10-CM | POA: Diagnosis not present

## 2019-04-21 DIAGNOSIS — M792 Neuralgia and neuritis, unspecified: Secondary | ICD-10-CM | POA: Diagnosis not present

## 2019-04-21 DIAGNOSIS — D649 Anemia, unspecified: Secondary | ICD-10-CM | POA: Diagnosis not present

## 2019-04-21 DIAGNOSIS — M810 Age-related osteoporosis without current pathological fracture: Secondary | ICD-10-CM | POA: Diagnosis not present

## 2019-04-21 DIAGNOSIS — L989 Disorder of the skin and subcutaneous tissue, unspecified: Secondary | ICD-10-CM | POA: Diagnosis not present

## 2019-04-21 DIAGNOSIS — E119 Type 2 diabetes mellitus without complications: Secondary | ICD-10-CM | POA: Diagnosis not present

## 2019-04-21 DIAGNOSIS — I10 Essential (primary) hypertension: Secondary | ICD-10-CM | POA: Diagnosis not present

## 2019-04-21 DIAGNOSIS — M5136 Other intervertebral disc degeneration, lumbar region: Secondary | ICD-10-CM | POA: Diagnosis not present

## 2019-04-28 ENCOUNTER — Ambulatory Visit: Payer: Medicare HMO | Attending: Internal Medicine

## 2019-04-28 DIAGNOSIS — Z23 Encounter for immunization: Secondary | ICD-10-CM

## 2019-04-28 NOTE — Progress Notes (Signed)
   Covid-19 Vaccination Clinic  Name:  [name]    MRN: [id number] DOB: February 14, 1941  04/28/2019  [name] was observed post Covid-19 immunization for 15 minutes without incident. She was provided with Vaccine Information Sheet and instruction to access the V-Safe system.   [name] was instructed to call 911 with any severe reactions post vaccine: Marland Kitchen Difficulty breathing  . Swelling of face and throat  . A fast heartbeat  . A bad rash all over body  . Dizziness and weakness   Immunizations Administered    Name Date Dose VIS Date Route   Pfizer COVID-19 Vaccine 04/28/2019 10:20 AM 0.3 mL 01/29/2019 Intramuscular   Manufacturer: [place]   Lot: [id number]   [place]: [id number]

## 2019-05-06 DIAGNOSIS — E538 Deficiency of other specified B group vitamins: Secondary | ICD-10-CM | POA: Diagnosis not present

## 2019-06-03 DIAGNOSIS — L820 Inflamed seborrheic keratosis: Secondary | ICD-10-CM | POA: Diagnosis not present

## 2019-06-03 DIAGNOSIS — L989 Disorder of the skin and subcutaneous tissue, unspecified: Secondary | ICD-10-CM | POA: Diagnosis not present

## 2019-06-07 DIAGNOSIS — E538 Deficiency of other specified B group vitamins: Secondary | ICD-10-CM | POA: Diagnosis not present

## 2019-07-08 DIAGNOSIS — D519 Vitamin B12 deficiency anemia, unspecified: Secondary | ICD-10-CM | POA: Diagnosis not present

## 2019-07-08 DIAGNOSIS — E538 Deficiency of other specified B group vitamins: Secondary | ICD-10-CM | POA: Diagnosis not present

## 2019-07-16 ENCOUNTER — Other Ambulatory Visit: Payer: Self-pay | Admitting: Internal Medicine

## 2019-07-16 DIAGNOSIS — Z1231 Encounter for screening mammogram for malignant neoplasm of breast: Secondary | ICD-10-CM

## 2019-08-11 DIAGNOSIS — M79605 Pain in left leg: Secondary | ICD-10-CM | POA: Diagnosis not present

## 2019-08-11 DIAGNOSIS — D649 Anemia, unspecified: Secondary | ICD-10-CM | POA: Diagnosis not present

## 2019-08-11 DIAGNOSIS — R809 Proteinuria, unspecified: Secondary | ICD-10-CM | POA: Diagnosis not present

## 2019-08-11 DIAGNOSIS — L989 Disorder of the skin and subcutaneous tissue, unspecified: Secondary | ICD-10-CM | POA: Diagnosis not present

## 2019-08-11 DIAGNOSIS — D519 Vitamin B12 deficiency anemia, unspecified: Secondary | ICD-10-CM | POA: Diagnosis not present

## 2019-08-11 DIAGNOSIS — M5416 Radiculopathy, lumbar region: Secondary | ICD-10-CM | POA: Diagnosis not present

## 2019-08-11 DIAGNOSIS — E1129 Type 2 diabetes mellitus with other diabetic kidney complication: Secondary | ICD-10-CM | POA: Diagnosis not present

## 2019-08-11 DIAGNOSIS — I350 Nonrheumatic aortic (valve) stenosis: Secondary | ICD-10-CM | POA: Diagnosis not present

## 2019-08-11 DIAGNOSIS — I10 Essential (primary) hypertension: Secondary | ICD-10-CM | POA: Diagnosis not present

## 2019-08-18 DIAGNOSIS — M79605 Pain in left leg: Secondary | ICD-10-CM | POA: Diagnosis not present

## 2019-08-18 DIAGNOSIS — M810 Age-related osteoporosis without current pathological fracture: Secondary | ICD-10-CM | POA: Diagnosis not present

## 2019-08-18 DIAGNOSIS — Z0000 Encounter for general adult medical examination without abnormal findings: Secondary | ICD-10-CM | POA: Diagnosis not present

## 2019-08-18 DIAGNOSIS — I10 Essential (primary) hypertension: Secondary | ICD-10-CM | POA: Diagnosis not present

## 2019-08-18 DIAGNOSIS — D649 Anemia, unspecified: Secondary | ICD-10-CM | POA: Diagnosis not present

## 2019-08-18 DIAGNOSIS — E785 Hyperlipidemia, unspecified: Secondary | ICD-10-CM | POA: Diagnosis not present

## 2019-08-18 DIAGNOSIS — Z7984 Long term (current) use of oral hypoglycemic drugs: Secondary | ICD-10-CM | POA: Diagnosis not present

## 2019-08-18 DIAGNOSIS — L989 Disorder of the skin and subcutaneous tissue, unspecified: Secondary | ICD-10-CM | POA: Diagnosis not present

## 2019-08-18 DIAGNOSIS — E119 Type 2 diabetes mellitus without complications: Secondary | ICD-10-CM | POA: Diagnosis not present

## 2019-08-19 DIAGNOSIS — Z124 Encounter for screening for malignant neoplasm of cervix: Secondary | ICD-10-CM | POA: Diagnosis not present

## 2019-08-19 DIAGNOSIS — Z01419 Encounter for gynecological examination (general) (routine) without abnormal findings: Secondary | ICD-10-CM | POA: Diagnosis not present

## 2019-08-19 DIAGNOSIS — R87610 Atypical squamous cells of undetermined significance on cytologic smear of cervix (ASC-US): Secondary | ICD-10-CM | POA: Diagnosis not present

## 2019-08-19 DIAGNOSIS — R87622 Low grade squamous intraepithelial lesion on cytologic smear of vagina (LGSIL): Secondary | ICD-10-CM | POA: Diagnosis not present

## 2019-08-19 DIAGNOSIS — Z1272 Encounter for screening for malignant neoplasm of vagina: Secondary | ICD-10-CM | POA: Diagnosis not present

## 2019-08-27 ENCOUNTER — Ambulatory Visit
Admission: RE | Admit: 2019-08-27 | Discharge: 2019-08-27 | Disposition: A | Discharge status: Home / Self Care | Payer: Medicare HMO | Source: Ambulatory Visit | Attending: Internal Medicine | Admitting: Internal Medicine

## 2019-08-27 DIAGNOSIS — Z1231 Encounter for screening mammogram for malignant neoplasm of breast: Secondary | ICD-10-CM | POA: Diagnosis not present

## 2019-09-17 DIAGNOSIS — E1136 Type 2 diabetes mellitus with diabetic cataract: Secondary | ICD-10-CM | POA: Diagnosis not present

## 2019-09-17 DIAGNOSIS — H401111 Primary open-angle glaucoma, right eye, mild stage: Secondary | ICD-10-CM | POA: Diagnosis not present

## 2019-09-20 DIAGNOSIS — D519 Vitamin B12 deficiency anemia, unspecified: Secondary | ICD-10-CM | POA: Diagnosis not present

## 2019-09-20 DIAGNOSIS — E538 Deficiency of other specified B group vitamins: Secondary | ICD-10-CM | POA: Diagnosis not present

## 2019-10-19 DIAGNOSIS — N890 Mild vaginal dysplasia: Secondary | ICD-10-CM | POA: Diagnosis not present

## 2019-10-19 DIAGNOSIS — Z8742 Personal history of other diseases of the female genital tract: Secondary | ICD-10-CM | POA: Diagnosis not present

## 2019-10-19 DIAGNOSIS — R87610 Atypical squamous cells of undetermined significance on cytologic smear of cervix (ASC-US): Secondary | ICD-10-CM | POA: Diagnosis not present

## 2019-10-21 DIAGNOSIS — D519 Vitamin B12 deficiency anemia, unspecified: Secondary | ICD-10-CM | POA: Diagnosis not present

## 2019-10-21 DIAGNOSIS — E538 Deficiency of other specified B group vitamins: Secondary | ICD-10-CM | POA: Diagnosis not present

## 2019-11-04 DIAGNOSIS — Z23 Encounter for immunization: Secondary | ICD-10-CM | POA: Diagnosis not present

## 2019-11-12 DIAGNOSIS — M5442 Lumbago with sciatica, left side: Secondary | ICD-10-CM | POA: Diagnosis not present

## 2019-11-12 DIAGNOSIS — G8929 Other chronic pain: Secondary | ICD-10-CM | POA: Diagnosis not present

## 2019-11-15 DIAGNOSIS — M545 Low back pain: Secondary | ICD-10-CM | POA: Diagnosis not present

## 2019-11-15 DIAGNOSIS — M5136 Other intervertebral disc degeneration, lumbar region: Secondary | ICD-10-CM | POA: Diagnosis not present

## 2019-11-15 DIAGNOSIS — M5416 Radiculopathy, lumbar region: Secondary | ICD-10-CM | POA: Diagnosis not present

## 2019-11-22 DIAGNOSIS — E538 Deficiency of other specified B group vitamins: Secondary | ICD-10-CM | POA: Diagnosis not present

## 2019-11-22 DIAGNOSIS — D519 Vitamin B12 deficiency anemia, unspecified: Secondary | ICD-10-CM | POA: Diagnosis not present

## 2019-11-25 DIAGNOSIS — M6281 Muscle weakness (generalized): Secondary | ICD-10-CM | POA: Diagnosis not present

## 2019-11-25 DIAGNOSIS — M5416 Radiculopathy, lumbar region: Secondary | ICD-10-CM | POA: Diagnosis not present

## 2019-11-29 DIAGNOSIS — M5416 Radiculopathy, lumbar region: Secondary | ICD-10-CM | POA: Diagnosis not present

## 2019-11-29 DIAGNOSIS — M6281 Muscle weakness (generalized): Secondary | ICD-10-CM | POA: Diagnosis not present

## 2019-12-01 ENCOUNTER — Encounter (INDEPENDENT_AMBULATORY_CARE_PROVIDER_SITE_OTHER): Payer: Self-pay

## 2019-12-01 ENCOUNTER — Other Ambulatory Visit: Payer: Self-pay

## 2019-12-01 ENCOUNTER — Inpatient Hospital Stay: Payer: Medicare HMO | Attending: Obstetrics and Gynecology | Admitting: Obstetrics and Gynecology

## 2019-12-01 VITALS — BP 128/67 | HR 62 | Temp 98.8°F | Resp 16 | Wt 137.9 lb

## 2019-12-01 DIAGNOSIS — M1990 Unspecified osteoarthritis, unspecified site: Secondary | ICD-10-CM | POA: Insufficient documentation

## 2019-12-01 DIAGNOSIS — K219 Gastro-esophageal reflux disease without esophagitis: Secondary | ICD-10-CM | POA: Insufficient documentation

## 2019-12-01 DIAGNOSIS — E119 Type 2 diabetes mellitus without complications: Secondary | ICD-10-CM | POA: Diagnosis not present

## 2019-12-01 DIAGNOSIS — R011 Cardiac murmur, unspecified: Secondary | ICD-10-CM | POA: Diagnosis not present

## 2019-12-01 DIAGNOSIS — N890 Mild vaginal dysplasia: Secondary | ICD-10-CM | POA: Diagnosis not present

## 2019-12-01 DIAGNOSIS — Z87891 Personal history of nicotine dependence: Secondary | ICD-10-CM | POA: Insufficient documentation

## 2019-12-01 DIAGNOSIS — Z7984 Long term (current) use of oral hypoglycemic drugs: Secondary | ICD-10-CM | POA: Insufficient documentation

## 2019-12-01 DIAGNOSIS — I10 Essential (primary) hypertension: Secondary | ICD-10-CM | POA: Diagnosis not present

## 2019-12-01 DIAGNOSIS — R87612 Low grade squamous intraepithelial lesion on cytologic smear of cervix (LGSIL): Secondary | ICD-10-CM | POA: Diagnosis not present

## 2019-12-01 DIAGNOSIS — M810 Age-related osteoporosis without current pathological fracture: Secondary | ICD-10-CM | POA: Insufficient documentation

## 2019-12-01 DIAGNOSIS — R87622 Low grade squamous intraepithelial lesion on cytologic smear of vagina (LGSIL): Secondary | ICD-10-CM

## 2019-12-01 DIAGNOSIS — E7800 Pure hypercholesterolemia, unspecified: Secondary | ICD-10-CM | POA: Diagnosis not present

## 2019-12-01 DIAGNOSIS — Z79899 Other long term (current) drug therapy: Secondary | ICD-10-CM | POA: Diagnosis not present

## 2019-12-01 DIAGNOSIS — N893 Dysplasia of vagina, unspecified: Secondary | ICD-10-CM

## 2019-12-01 NOTE — Progress Notes (Signed)
Pt has no bleeding or drainage, no pain today

## 2019-12-01 NOTE — Progress Notes (Signed)
Gynecologic Oncology Consult Visit   Referring Provider: Dr. 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   -S/p TVH for abnormal bleeding in her 52s.  - 05/2015 pap with LGSIL and +HPV; colpo with vaginal Bx, neg - 07/2016 pap with LGSIL with +HPV; colpo with vaginal cuff Bx, neg - 07/2017 pap with LGSIL; no biopsy - 05/2018 pap with insufficient cells --->vaginal estrogen and repeat in 06/2018: LGSIL/ no HPV tested - 07/2018 colpo with bx: LGSIL (CIN I), consistent with VAIN1 - 08/19/2019 Pap ASCUS - endocervical cell noted on Pap - 10/19/2019 Colposcopy +ACE at 6 o'clock but neg [place]. Biopsy VAIN1 at vaginal cuff 6 o'clock; Pathology VAIN1/LGSIL  Today patient denies specific complaints.    Problem List: Patient Active Problem List   Diagnosis Date Noted  . Lumbar radiculitis 10/30/2016    Past Medical History: Past Medical History:  Diagnosis Date  . Anemia, unspecified   . Arthritis   . Diabetes mellitus without complication ([place])   . Disorder of bursae and tendons in shoulder region    unspecified  . Diverticulitis    had a GI bleed with this in the past  . Diverticulosis   . Essential hypertension, benign   . GERD (gastroesophageal reflux disease)   . Heart murmur    followed by PCP  . History of GI bleed   . Hypertension   . Mitral valve disorder   . Nephrolithiasis   . Osteoporosis   . Pure hypercholesterolemia   . 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 ([place])    unspecified type diabetes without mention of complication, not stated as uncontrolled    Past Surgical History: Past Surgical History:  Procedure Laterality Date  . ABDOMINAL HYSTERECTOMY    . ANTERIOR THORACIC SPINE FUSION MULTIPLE LEVELS  10/08/2016   Procedure: ARTHRODESIS, ANTERIOR INTERBODY TECHNIQUE, INCLUDING MINIMAL DISCECTOMY TO PREPARE INTERSPACE (OTHER THAN  FOR DECOMPRESSION); LUMBAR; Surgeon: Ricard Dillon, MD; Location: [place]; Service: Neurosurgery; Laterality: N/A;  . ARTHRODESIS ANTEROR CERVICAL SPINE  10/08/2016   Procedure: ARTHRODESIS, ANTERIOR INTERBODY TECHNIQUE, INCLUDING MINIMAL DISCECTOMY TO PREPARE INTERSPACE (OTHER THAN DECOMPRESSION); EACH ADDITIONAL INTERSPACE (LIST IN ADDITION TO CODE FOR PRIMARY PROCEDURE); Surgeon: Ricard Dillon, MD; Location: [place]; Service: Neurosurgery; Laterality: N/A;  . BREAST EXCISIONAL BIOPSY Left 1970   neg  . CATARACT EXTRACTION W/PHACO Right 10/18/2015   Procedure: CATARACT EXTRACTION PHACO AND INTRAOCULAR LENS PLACEMENT (IOC);  Surgeon: Leandrew Koyanagi, MD;  Location: [place];  Service: Ophthalmology;  Laterality: Right;  DIABETIC - oral meds KEEP PT TIME AFTER 8 PER PT KEEP 5TH  . COLONOSCOPY WITH PROPOFOL N/A 07/18/2017   Procedure: COLONOSCOPY WITH PROPOFOL;  Surgeon: Manya Silvas, MD;  [facility] ENDOSCOPY;  Service: Endoscopy;  Laterality: N/A;  . COLPOSCOPY  2017   vag colp-neg TVH  . INSTRUMENTATION POSTERIOR SPINE 3 TO 6 VERTERAL SEGMENTS  10/08/2016   Procedure: POSTERIOR SEGMENTAL INSTRUMENTATION (EG, PEDICLE FIXATION, DUAL RODS WITH MULTIPLE HOOKS AND SUBLAMINAR WIRES); 3 TO 6 VERTEBRAL SEGMENTS (LIST IN ADDITION TO PRIMARY PROCEDURE); Surgeon: Ricard Dillon, MD; Location: [place]; Service: Neurosurgery; Laterality: N/A;  . PARTIAL HYSTERECTOMY     in the 70's  . POSTERIOR LUMBAR SPINE FUSION ONE LEVEL LATERAL TRANSVERSE TECHNIQUE  10/08/2016   Procedure: ARTHRODESIS, POSTERIOR OR POSTEROLATERAL TECHNIQUE, SINGLE LEVEL; LUMBAR (WITH LATERAL TRANSVERSE TECHNIQUE, WHEN  PERFORMED); Surgeon: Ricard Dillon, MD; Location: [place]; Service: Neurosurgery; Laterality: N/A;  . STRESS FRACTURE OF RIGHT FOOT    . TOTAL VAGINAL HYSTERECTOMY      Past Gynecologic History:  Post-menopausal Contraception: none Sexually active:  not sexually active  OB History:  OB History  Gravida Para Term Preterm AB Living  3 3 3         SAB TAB Ectopic Multiple Live Births               # Outcome Date GA Lbr Len/2nd Weight Sex Delivery Anes PTL Lv  3 Term           2 Term           1 Term             Family History: Family History  Problem Relation Age of Onset  . Thyroid disease Mother   . Pulmonary embolism Father   . Diabetes Sister   . Hypertension Sister   . Lupus Sister   . Coronary artery disease Other        uncle  . Breast cancer Neg Hx     Social History: Social History   Socioeconomic History  . Marital status: Divorced    Spouse name: Not on file  . Number of children: 3  . Years of education: college  . Highest education level: Not on file  Occupational History    Comment: retired Community education officer  Tobacco Use  . Smoking status: Former Smoker    Packs/day: 2.00    Years: 20.00    Pack years: 40.00    Types: Cigarettes    Quit date: 1985    Years since quitting: 36.8  . Smokeless tobacco: Never Used  Vaping Use  . Vaping Use: Never used  Substance and Sexual Activity  . Alcohol use: Yes    Alcohol/week: 2.0 standard drinks    Types: 2 Glasses of wine per week    Comment: socially  . Drug use: Never  . Sexual activity: Not on file  Other Topics Concern  . Not on file  Social History Narrative   Former smoker   Drinks 2 glasses of wine socially   3 sons   Divorced   DNR   Social Determinants of Health   Financial Resource Strain:   . Difficulty of Paying Living Expenses: Not on file  Food Insecurity:   . Worried About Charity fundraiser in the Last Year: Not on file  . Ran Out of Food in the Last Year: Not on file  Transportation Needs:   . Lack of Transportation (Medical): Not on file  . Lack of Transportation (Non-Medical): Not on file  Physical Activity:   . Days of Exercise per Week: Not on file  . Minutes of Exercise per Session: Not on file  Stress:   .  Feeling of Stress : Not on file  Social Connections:   . Frequency of Communication with Friends and Family: Not on file  . Frequency of Social Gatherings with Friends and Family: Not on file  . Attends Religious Services: Not on file  . Active Member of Clubs or Organizations: Not on file  . Attends Archivist Meetings: Not on file  . Marital Status: Not on file  Intimate Partner Violence:   . Fear of Current or Ex-Partner: Not on file  . Emotionally Abused: Not on file  . Physically Abused: Not on file  . Sexually Abused:  Not on file    Allergies: Allergies  Allergen Reactions  . Shellfish Allergy Nausea And Vomiting    Current Medications: Current Outpatient Medications  Medication Sig Dispense Refill  . acetaminophen (TYLENOL) 325 MG tablet Take 650 mg by mouth every 4 (four) hours as needed. For pain or increased fever. Max dose for 24 hr is 3000 mg from all sources of Apap/tylenol    . alendronate (FOSAMAX) 35 MG tablet Take 35 mg by mouth every 7 (seven) days. On Fridays. Take with a full glass of water on an empty stomach.    Marland Kitchen amLODipine (NORVASC) 2.5 MG tablet Take 2.5 mg by mouth 2 (two) times daily.    Marland Kitchen atorvastatin (LIPITOR) 40 MG tablet Take 40 mg by mouth daily.    . calcium carbonate (OSCAL) 1500 (600 Ca) MG TABS tablet Take by mouth 2 (two) times daily with a meal.    . ferrous gluconate (FERGON) 324 MG tablet Take 324 mg by mouth 2 (two) times daily with a meal.    . glipiZIDE (GLUCOTROL) 10 MG tablet Take 10 mg by mouth daily before breakfast.    . glipiZIDE (GLUCOTROL) 10 MG tablet Take 5 mg by mouth at bedtime. 1/2 tablet    . hydrochlorothiazide (HYDRODIURIL) 25 MG tablet Take 25 mg by mouth daily.     Marland Kitchen lisinopril (PRINIVIL,ZESTRIL) 40 MG tablet Take 40 mg by mouth daily.     . metformin (FORTAMET) 1000 MG (OSM) 24 hr tablet Take 1,000 mg by mouth daily at 6 PM.     . metFORMIN (GLUCOPHAGE) 500 MG tablet Take 500 mg by mouth daily with breakfast.     . metoprolol succinate (TOPROL-XL) 25 MG 24 hr tablet Take 25 mg by mouth daily.    . promethazine (PHENERGAN) 25 MG tablet Take 12.5-25 mg by mouth every 8 (eight) hours as needed for nausea or vomiting. 1/2 - 1 tablet    . sitaGLIPtin (JANUVIA) 100 MG tablet Take 100 mg by mouth daily.     . timolol (TIMOPTIC) 0.25 % ophthalmic solution Place 1 drop into the right eye 2 (two) times daily.    . traMADol (ULTRAM) 50 MG tablet Take 1-2 tablets (50-100 mg total) by mouth every 4 (four) hours as needed for moderate pain or severe pain. 1 tablet for moderate pain and 2 tablets for severe pain 120 tablet 0  . vitamin C (ASCORBIC ACID) 500 MG tablet Take 500 mg by mouth 2 (two) times daily.     No current facility-administered medications for this visit.   Review of Systems.  General: negative for fevers, changes in weight or night sweats Skin: negative for changes in moles or sores or rash Eyes: negative for changes in vision HEENT: negative for change in hearing, tinnitus, voice changes Pulmonary: negative for dyspnea, orthopnea, productive cough, wheezing Cardiac: negative for palpitations, pain Gastrointestinal: negative for nausea, vomiting, constipation, diarrhea, hematemesis, hematochezia Genitourinary/Sexual: negative for dysuria, retention, hematuria, incontinence Ob/Gyn:  negative for abnormal bleeding, or pain Musculoskeletal: negative for pain, joint pain, back pain Hematology: negative for easy bruising, abnormal bleeding Neurologic/Psych: negative for headaches, seizures, paralysis, weakness, numbness   Objective:  Physical Examination:  BP 128/67   Pulse 62   Temp 98.8 F (37.1 C) (Oral)   Resp 16   Wt 137 lb 14.4 oz (62.6 kg)   BMI 26.06 kg/m     ECOG Performance Status: 0 - Asymptomatic  GENERAL: Patient is a well appearing female in no acute distress  HEENT:  Neck supple NODES:  No cervical, supraclavicular, axillary, or inguinal lymphadenopathy palpated.  LUNGS:   Clear to auscultation bilaterally.  No wheezes or rhonchi. HEART:  Regular rate and rhythm. No murmur appreciated. ABDOMEN:  Soft, nontender.  MSK:  No focal spinal tenderness to palpation. Full range of motion bilaterally in the upper extremities. EXTREMITIES:  No peripheral edema.   SKIN:  Clear with no obvious rashes or skin changes.  NEURO:  Nonfocal. Well oriented.  Appropriate affect.  Pelvic: Chaperoned by Nursing EGBUS: no lesions Cervix: surgically absent Vagina: no lesions, no discharge or bleeding Uterus: surgically absent Adnexa: no palpable masses Rectovaginal: deferred  Lab Review Labs on site today: No labs on site today  Radiologic Imaging: No imaging on site today.  PROCEDURE: The risks and benefits of the procedure were reviewed and informed consent obtained. Time out was performed. The patient received pre-procedure teaching and expressed understanding. The post-procedure instructions were reviewed with the patient and she expressed understanding. The patient does not have any barriers to learning.  Colposcopy of the upper vagina and cervix was performed after application os acetic acid. The findings revealed AWE involving the vaginal cuff. The biopsy site has healed. There is another area of AWE on the posterior wall of the vagina ~ 4 cm distal from the cuff.  TBiopsies were not performed. Procedure completed without complications. The patient tolerated the procedure well.   Post-procedure evaluation the patient was stable without complaints.     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   Medical co-morbidities complicating care: HTN Plan:   Problem List Items Addressed This Visit      Genitourinary   VAIN (vaginal intraepithelial neoplasia) - Primary     Other   LGSIL Pap smear of vagina      We discussed options for management including continued observation which is preferred based on ASCCP guidelines (extrapolated  from cervical dysplasia guidelines) noted below versus surgical management. She has opted for conservative management with repeat Pap and HPV testing in one year.    Guideline: For patients 25 years or older with histologic LSIL (CIN 1) who is diagnosed at consecutive visits for at least 2 years, observation is preferred (BII) but treatment is acceptable (CIII).   Suggested return to clinic in  1 year.    The patient's diagnosis, an outline of the further diagnostic and laboratory studies which will be required, the recommendation for surgery, and alternatives were discussed with her and her accompanying family members.  All questions were answered to their satisfaction.  A total of 60 minutes were spent with the patient/family today; >50% was spent in education, counseling and coordination of care for [place].  Verlon Au, NP  I personally had a face to face interaction and evaluated the patient jointly with the NP, Ms. Beckey Rutter.  I have reviewed her history and available records and have performed the key portions of the physical exam including abdominal exam, pelvic exam, and colposcopy with my findings confirming those documented above by the APP.  I have discussed the case with the APP and the patient.  I agree with the above documentation, assessment and plan which was fully formulated by me.  Counseling was completed by me.   I personally saw the patient and performed a substantive portion of this encounter in conjunction with the listed APP as documented above.  Fabiha Rougeau Gaetana Michaelis, MD   CC:  Benjaman Kindler, MD  [address] [place],  [place] [address] [phone]

## 2019-12-06 DIAGNOSIS — M5416 Radiculopathy, lumbar region: Secondary | ICD-10-CM | POA: Diagnosis not present

## 2019-12-06 DIAGNOSIS — M6281 Muscle weakness (generalized): Secondary | ICD-10-CM | POA: Diagnosis not present

## 2019-12-08 DIAGNOSIS — M5416 Radiculopathy, lumbar region: Secondary | ICD-10-CM | POA: Diagnosis not present

## 2019-12-08 DIAGNOSIS — M6281 Muscle weakness (generalized): Secondary | ICD-10-CM | POA: Diagnosis not present

## 2019-12-13 DIAGNOSIS — M5416 Radiculopathy, lumbar region: Secondary | ICD-10-CM | POA: Diagnosis not present

## 2019-12-13 DIAGNOSIS — D649 Anemia, unspecified: Secondary | ICD-10-CM | POA: Diagnosis not present

## 2019-12-13 DIAGNOSIS — I10 Essential (primary) hypertension: Secondary | ICD-10-CM | POA: Diagnosis not present

## 2019-12-13 DIAGNOSIS — R809 Proteinuria, unspecified: Secondary | ICD-10-CM | POA: Diagnosis not present

## 2019-12-13 DIAGNOSIS — E1129 Type 2 diabetes mellitus with other diabetic kidney complication: Secondary | ICD-10-CM | POA: Diagnosis not present

## 2019-12-13 DIAGNOSIS — D519 Vitamin B12 deficiency anemia, unspecified: Secondary | ICD-10-CM | POA: Diagnosis not present

## 2019-12-17 DIAGNOSIS — M6281 Muscle weakness (generalized): Secondary | ICD-10-CM | POA: Diagnosis not present

## 2019-12-17 DIAGNOSIS — M5416 Radiculopathy, lumbar region: Secondary | ICD-10-CM | POA: Diagnosis not present

## 2019-12-20 DIAGNOSIS — R011 Cardiac murmur, unspecified: Secondary | ICD-10-CM | POA: Diagnosis not present

## 2019-12-20 DIAGNOSIS — E119 Type 2 diabetes mellitus without complications: Secondary | ICD-10-CM | POA: Diagnosis not present

## 2019-12-20 DIAGNOSIS — I10 Essential (primary) hypertension: Secondary | ICD-10-CM | POA: Diagnosis not present

## 2019-12-20 DIAGNOSIS — Z79899 Other long term (current) drug therapy: Secondary | ICD-10-CM | POA: Diagnosis not present

## 2019-12-20 DIAGNOSIS — D649 Anemia, unspecified: Secondary | ICD-10-CM | POA: Diagnosis not present

## 2019-12-20 DIAGNOSIS — E785 Hyperlipidemia, unspecified: Secondary | ICD-10-CM | POA: Diagnosis not present

## 2019-12-20 DIAGNOSIS — M5416 Radiculopathy, lumbar region: Secondary | ICD-10-CM | POA: Diagnosis not present

## 2019-12-20 DIAGNOSIS — M6281 Muscle weakness (generalized): Secondary | ICD-10-CM | POA: Diagnosis not present

## 2019-12-24 DIAGNOSIS — E538 Deficiency of other specified B group vitamins: Secondary | ICD-10-CM | POA: Diagnosis not present

## 2019-12-27 DIAGNOSIS — M5416 Radiculopathy, lumbar region: Secondary | ICD-10-CM | POA: Diagnosis not present

## 2019-12-27 DIAGNOSIS — M5136 Other intervertebral disc degeneration, lumbar region: Secondary | ICD-10-CM | POA: Diagnosis not present

## 2020-01-19 DIAGNOSIS — E7800 Pure hypercholesterolemia, unspecified: Secondary | ICD-10-CM | POA: Diagnosis not present

## 2020-01-19 DIAGNOSIS — I358 Other nonrheumatic aortic valve disorders: Secondary | ICD-10-CM | POA: Diagnosis not present

## 2020-01-24 DIAGNOSIS — E538 Deficiency of other specified B group vitamins: Secondary | ICD-10-CM | POA: Diagnosis not present

## 2020-02-24 DIAGNOSIS — E538 Deficiency of other specified B group vitamins: Secondary | ICD-10-CM | POA: Diagnosis not present

## 2020-03-15 DIAGNOSIS — H401111 Primary open-angle glaucoma, right eye, mild stage: Secondary | ICD-10-CM | POA: Diagnosis not present

## 2020-03-23 DIAGNOSIS — H401111 Primary open-angle glaucoma, right eye, mild stage: Secondary | ICD-10-CM | POA: Diagnosis not present

## 2020-03-27 DIAGNOSIS — E538 Deficiency of other specified B group vitamins: Secondary | ICD-10-CM | POA: Diagnosis not present

## 2020-04-10 DIAGNOSIS — Z79899 Other long term (current) drug therapy: Secondary | ICD-10-CM | POA: Diagnosis not present

## 2020-04-10 DIAGNOSIS — E1129 Type 2 diabetes mellitus with other diabetic kidney complication: Secondary | ICD-10-CM | POA: Diagnosis not present

## 2020-04-10 DIAGNOSIS — I10 Essential (primary) hypertension: Secondary | ICD-10-CM | POA: Diagnosis not present

## 2020-04-10 DIAGNOSIS — R87612 Low grade squamous intraepithelial lesion on cytologic smear of cervix (LGSIL): Secondary | ICD-10-CM | POA: Diagnosis not present

## 2020-04-10 DIAGNOSIS — M5136 Other intervertebral disc degeneration, lumbar region: Secondary | ICD-10-CM | POA: Diagnosis not present

## 2020-04-10 DIAGNOSIS — E7800 Pure hypercholesterolemia, unspecified: Secondary | ICD-10-CM | POA: Diagnosis not present

## 2020-04-10 DIAGNOSIS — R809 Proteinuria, unspecified: Secondary | ICD-10-CM | POA: Diagnosis not present

## 2020-04-10 DIAGNOSIS — D649 Anemia, unspecified: Secondary | ICD-10-CM | POA: Diagnosis not present

## 2020-04-18 DIAGNOSIS — I10 Essential (primary) hypertension: Secondary | ICD-10-CM | POA: Diagnosis not present

## 2020-04-18 DIAGNOSIS — E119 Type 2 diabetes mellitus without complications: Secondary | ICD-10-CM | POA: Diagnosis not present

## 2020-04-18 DIAGNOSIS — Z79899 Other long term (current) drug therapy: Secondary | ICD-10-CM | POA: Diagnosis not present

## 2020-04-18 DIAGNOSIS — E785 Hyperlipidemia, unspecified: Secondary | ICD-10-CM | POA: Diagnosis not present

## 2020-04-18 DIAGNOSIS — D649 Anemia, unspecified: Secondary | ICD-10-CM | POA: Diagnosis not present

## 2020-07-04 DIAGNOSIS — M7731 Calcaneal spur, right foot: Secondary | ICD-10-CM | POA: Diagnosis not present

## 2020-07-04 DIAGNOSIS — M67471 Ganglion, right ankle and foot: Secondary | ICD-10-CM | POA: Diagnosis not present

## 2020-07-14 ENCOUNTER — Emergency Department: Payer: Medicare HMO

## 2020-07-14 ENCOUNTER — Encounter: Payer: Self-pay | Admitting: Emergency Medicine

## 2020-07-14 ENCOUNTER — Emergency Department
Admission: EM | Admit: 2020-07-14 | Discharge: 2020-07-15 | Disposition: A | Discharge status: Home / Self Care | Payer: Medicare HMO | Attending: Emergency Medicine | Admitting: Emergency Medicine

## 2020-07-14 DIAGNOSIS — Z79899 Other long term (current) drug therapy: Secondary | ICD-10-CM | POA: Insufficient documentation

## 2020-07-14 DIAGNOSIS — E119 Type 2 diabetes mellitus without complications: Secondary | ICD-10-CM | POA: Insufficient documentation

## 2020-07-14 DIAGNOSIS — M40204 Unspecified kyphosis, thoracic region: Secondary | ICD-10-CM | POA: Diagnosis not present

## 2020-07-14 DIAGNOSIS — Z87891 Personal history of nicotine dependence: Secondary | ICD-10-CM | POA: Insufficient documentation

## 2020-07-14 DIAGNOSIS — S299XXA Unspecified injury of thorax, initial encounter: Secondary | ICD-10-CM | POA: Diagnosis present

## 2020-07-14 DIAGNOSIS — X58XXXA Exposure to other specified factors, initial encounter: Secondary | ICD-10-CM | POA: Insufficient documentation

## 2020-07-14 DIAGNOSIS — I10 Essential (primary) hypertension: Secondary | ICD-10-CM | POA: Diagnosis not present

## 2020-07-14 DIAGNOSIS — S2232XA Fracture of one rib, left side, initial encounter for closed fracture: Secondary | ICD-10-CM | POA: Insufficient documentation

## 2020-07-14 DIAGNOSIS — K5750 Diverticulosis of both small and large intestine without perforation or abscess without bleeding: Secondary | ICD-10-CM | POA: Diagnosis not present

## 2020-07-14 DIAGNOSIS — S2242XA Multiple fractures of ribs, left side, initial encounter for closed fracture: Secondary | ICD-10-CM | POA: Diagnosis not present

## 2020-07-14 DIAGNOSIS — M5137 Other intervertebral disc degeneration, lumbosacral region: Secondary | ICD-10-CM | POA: Diagnosis not present

## 2020-07-14 DIAGNOSIS — M545 Low back pain, unspecified: Secondary | ICD-10-CM | POA: Diagnosis not present

## 2020-07-14 DIAGNOSIS — Z7984 Long term (current) use of oral hypoglycemic drugs: Secondary | ICD-10-CM | POA: Diagnosis not present

## 2020-07-14 DIAGNOSIS — J9811 Atelectasis: Secondary | ICD-10-CM | POA: Diagnosis not present

## 2020-07-14 DIAGNOSIS — N281 Cyst of kidney, acquired: Secondary | ICD-10-CM | POA: Diagnosis not present

## 2020-07-14 LAB — URINALYSIS, COMPLETE (UACMP) WITH MICROSCOPIC
Bilirubin Urine: NEGATIVE
Glucose, UA: NEGATIVE mg/dL
Hgb urine dipstick: NEGATIVE
Ketones, ur: NEGATIVE mg/dL
Leukocytes,Ua: NEGATIVE
Nitrite: NEGATIVE
Protein, ur: 100 mg/dL — AB
Specific Gravity, Urine: 1.014 (ref 1.005–1.030)
pH: 5 (ref 5.0–8.0)

## 2020-07-14 MED ORDER — OXYCODONE-ACETAMINOPHEN 5-325 MG PO TABS
1.0000 | ORAL_TABLET | Freq: Once | ORAL | Status: AC
Start: 2020-07-14 — End: 2020-07-14
  Administered 2020-07-14: 1 via ORAL
  Filled 2020-07-14: qty 1

## 2020-07-14 NOTE — ED Triage Notes (Signed)
Pt reports she has been having left mid back pain since yesterday, denies any injuries. Pt denies any urinary symptoms. Pt talks in complete sentences denies any other symptoms

## 2020-07-14 NOTE — ED Notes (Signed)
Pt felt nauseated after taking pain medication, wanted to try some crackers to settle her stomach.

## 2020-07-15 ENCOUNTER — Emergency Department: Payer: Medicare HMO

## 2020-07-15 DIAGNOSIS — S2242XA Multiple fractures of ribs, left side, initial encounter for closed fracture: Secondary | ICD-10-CM | POA: Diagnosis not present

## 2020-07-15 DIAGNOSIS — S2232XA Fracture of one rib, left side, initial encounter for closed fracture: Secondary | ICD-10-CM | POA: Diagnosis not present

## 2020-07-15 DIAGNOSIS — N281 Cyst of kidney, acquired: Secondary | ICD-10-CM | POA: Diagnosis not present

## 2020-07-15 DIAGNOSIS — K5750 Diverticulosis of both small and large intestine without perforation or abscess without bleeding: Secondary | ICD-10-CM | POA: Diagnosis not present

## 2020-07-15 DIAGNOSIS — M40204 Unspecified kyphosis, thoracic region: Secondary | ICD-10-CM | POA: Diagnosis not present

## 2020-07-15 DIAGNOSIS — J9811 Atelectasis: Secondary | ICD-10-CM | POA: Diagnosis not present

## 2020-07-15 DIAGNOSIS — M5137 Other intervertebral disc degeneration, lumbosacral region: Secondary | ICD-10-CM | POA: Diagnosis not present

## 2020-07-15 LAB — CBC WITH DIFFERENTIAL/PLATELET
Abs Immature Granulocytes: 0.08 10*3/uL — ABNORMAL HIGH (ref 0.00–0.07)
Basophils Absolute: 0.1 10*3/uL (ref 0.0–0.1)
Basophils Relative: 0 %
Eosinophils Absolute: 0.1 10*3/uL (ref 0.0–0.5)
Eosinophils Relative: 1 %
HCT: 33.5 % — ABNORMAL LOW (ref 36.0–46.0)
Hemoglobin: 10.8 g/dL — ABNORMAL LOW (ref 12.0–15.0)
Immature Granulocytes: 1 %
Lymphocytes Relative: 16 %
Lymphs Abs: 2.5 10*3/uL (ref 0.7–4.0)
MCH: 28.8 pg (ref 26.0–34.0)
MCHC: 32.2 g/dL (ref 30.0–36.0)
MCV: 89.3 fL (ref 80.0–100.0)
Monocytes Absolute: 0.9 10*3/uL (ref 0.1–1.0)
Monocytes Relative: 6 %
Neutro Abs: 11.9 10*3/uL — ABNORMAL HIGH (ref 1.7–7.7)
Neutrophils Relative %: 76 %
Platelets: 334 10*3/uL (ref 150–400)
RBC: 3.75 MIL/uL — ABNORMAL LOW (ref 3.87–5.11)
RDW: 14 % (ref 11.5–15.5)
WBC: 15.5 10*3/uL — ABNORMAL HIGH (ref 4.0–10.5)
nRBC: 0 % (ref 0.0–0.2)

## 2020-07-15 LAB — BASIC METABOLIC PANEL
Anion gap: 11 (ref 5–15)
BUN: 24 mg/dL — ABNORMAL HIGH (ref 8–23)
CO2: 27 mmol/L (ref 22–32)
Calcium: 9.5 mg/dL (ref 8.9–10.3)
Chloride: 95 mmol/L — ABNORMAL LOW (ref 98–111)
Creatinine, Ser: 0.95 mg/dL (ref 0.44–1.00)
GFR, Estimated: >60 mL/min (ref >60)
Glucose, Bld: 132 mg/dL — ABNORMAL HIGH (ref 70–99)
Potassium: 4.3 mmol/L (ref 3.5–5.1)
Sodium: 133 mmol/L — ABNORMAL LOW (ref 135–145)

## 2020-07-15 LAB — D-DIMER, QUANTITATIVE: D-Dimer, Quant: 3.44 ug/mL-FEU — ABNORMAL HIGH (ref 0.00–0.50)

## 2020-07-15 MED ORDER — IOHEXOL 350 MG/ML SOLN
100.0000 mL | Freq: Once | INTRAVENOUS | Status: AC | PRN
  Administered 2020-07-15: 100 mL via INTRAVENOUS

## 2020-07-15 MED ORDER — LIDOCAINE 5 % EX PTCH
1.0000 | MEDICATED_PATCH | CUTANEOUS | Status: DC
  Administered 2020-07-15: 1 via TRANSDERMAL
  Filled 2020-07-15: qty 1

## 2020-07-15 MED ORDER — ONDANSETRON HCL 4 MG/2ML IJ SOLN
4.0000 mg | Freq: Once | INTRAMUSCULAR | Status: AC
  Administered 2020-07-15: 4 mg via INTRAVENOUS
  Filled 2020-07-15: qty 2

## 2020-07-15 MED ORDER — ACETAMINOPHEN 500 MG PO TABS: 1000.0000 mg | ORAL_TABLET | Freq: Three times a day (TID) | ORAL | 0 refills | Status: AC | PRN

## 2020-07-15 MED ORDER — TRAMADOL HCL 50 MG PO TABS: 50.0000 mg | ORAL_TABLET | Freq: Four times a day (QID) | ORAL | 0 refills | Status: AC | PRN

## 2020-07-15 MED ORDER — DULCOLAX 5 MG PO TBEC: 5.0000 mg | DELAYED_RELEASE_TABLET | Freq: Every day | ORAL | 1 refills | Status: AC | PRN

## 2020-07-15 NOTE — ED Provider Notes (Signed)
[facility] Emergency Department Provider Note  ____________________________________________  Time seen: Approximately 3:36 AM  I have reviewed the triage vital signs and the nursing notes.   HISTORY  Chief Complaint Back Pain   HPI Jordan Small is a 80 y.o. female history of diabetes, diverticulitis, hypertension, kidney stones, GI bleed discectomy of the lumbar spine and spinal fusion of L3-L5 in 2018 who presents for evaluation of back pain.  Patient reports that the pain started yesterday while she was at work.  She works as a Optometrist.  She reports that she was walking when the pain started.  She describes the pain as a dull ache deep located on the left flank and left lower back.  The pain is worse when she coughs or moves.  She denies any shortness of breath or chest pain.  She denies any personal or family history of PE or DVT, recent travel immobilization, leg pain or swelling, hemoptysis, or exogenous hormones.  She denies midline pain, lower extremity weakness or numbness, saddle anesthesia, bowel or bladder incontinence or retention, dysuria, frequency, hematuria.  She also denies abdominal pain, nausea, vomiting, fever or chills.  The pain has been constant for 2 days.  Has not taken anything at home for the pain.  After receiving a Percocet in the waiting room patient started feeling nauseous because she was on an empty stomach.  She denies history of chronic back pain since her surgery in 2018.  Past Medical History:  Diagnosis Date  . Anemia, unspecified   . Arthritis   . Diabetes mellitus without complication ([place])   . Disorder of bursae and tendons in shoulder region    unspecified  . Diverticulitis    had a GI bleed with this in the past  . Diverticulosis   . Essential hypertension, benign   . GERD (gastroesophageal reflux disease)   . Heart murmur    followed by PCP  . History of GI bleed   . Hypertension   . Mitral valve  disorder   . Nephrolithiasis   . Osteoporosis   . Pure hypercholesterolemia   . 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 ([place])    unspecified type diabetes without mention of complication, not stated as uncontrolled    Patient Active Problem List   Diagnosis Date Noted  . 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 LGSIL Pap smear of vagina 12/01/2019  . Lumbar radiculitis 10/30/2016    Past Surgical History:  Procedure Laterality Date  . ABDOMINAL HYSTERECTOMY    . ANTERIOR THORACIC SPINE FUSION MULTIPLE LEVELS  10/08/2016   Procedure: ARTHRODESIS, ANTERIOR INTERBODY TECHNIQUE, INCLUDING MINIMAL DISCECTOMY TO PREPARE INTERSPACE (OTHER THAN FOR DECOMPRESSION); LUMBAR; Surgeon: Ricard Dillon, MD; Location: [place]; Service: Neurosurgery; Laterality: N/A;  . ARTHRODESIS ANTEROR CERVICAL SPINE  10/08/2016   Procedure: ARTHRODESIS, ANTERIOR INTERBODY TECHNIQUE, INCLUDING MINIMAL DISCECTOMY TO PREPARE INTERSPACE (OTHER THAN DECOMPRESSION); EACH ADDITIONAL INTERSPACE (LIST IN ADDITION TO CODE FOR PRIMARY PROCEDURE); Surgeon: Ricard Dillon, MD; Location: [place]; Service: Neurosurgery; Laterality: N/A;  . BREAST EXCISIONAL BIOPSY Left 1970   neg  . CATARACT EXTRACTION W/PHACO Right 10/18/2015   Procedure: CATARACT EXTRACTION PHACO AND INTRAOCULAR LENS PLACEMENT (IOC);  Surgeon: Leandrew Koyanagi, MD;  Location: [place];  Service: Ophthalmology;  Laterality: Right;  DIABETIC - oral meds KEEP PT TIME AFTER 8 PER PT KEEP 5TH  . COLONOSCOPY WITH PROPOFOL N/A 07/18/2017   Procedure: COLONOSCOPY WITH PROPOFOL;  Surgeon: Manya Silvas, MD;  Location: [facility];  Service: Endoscopy;  Laterality: N/A;  . COLPOSCOPY  2017   vag colp-neg [facility]  . INSTRUMENTATION POSTERIOR SPINE 3 TO 6 VERTERAL SEGMENTS  10/08/2016   Procedure: POSTERIOR SEGMENTAL INSTRUMENTATION (EG, PEDICLE FIXATION, DUAL RODS WITH MULTIPLE HOOKS AND SUBLAMINAR  WIRES); 3 TO 6 VERTEBRAL SEGMENTS (LIST IN ADDITION TO PRIMARY PROCEDURE); Surgeon: Ricard Dillon, MD; Location: [place]; Service: Neurosurgery; Laterality: N/A;  . PARTIAL HYSTERECTOMY     in the 70's  . POSTERIOR LUMBAR SPINE FUSION ONE LEVEL LATERAL TRANSVERSE TECHNIQUE  10/08/2016   Procedure: ARTHRODESIS, POSTERIOR OR POSTEROLATERAL TECHNIQUE, SINGLE LEVEL; LUMBAR (WITH LATERAL TRANSVERSE TECHNIQUE, WHEN PERFORMED); Surgeon: Ricard Dillon, MD; Location: [place]; Service: Neurosurgery; Laterality: N/A;  . STRESS FRACTURE OF RIGHT FOOT    . TOTAL VAGINAL HYSTERECTOMY      Prior to Admission medications   Medication Sig Start Date End Date Taking? Authorizing Provider  acetaminophen (TYLENOL) 500 MG tablet Take 2 tablets (1,000 mg total) by mouth every 8 (eight) hours as needed for mild pain, moderate pain, fever or headache. 07/15/20 07/15/21 Yes Alfred Levins, [place], MD  bisacodyl (DULCOLAX) 5 MG EC tablet Take 1 tablet (5 mg total) by mouth daily as needed for moderate constipation. 07/15/20 07/15/21 Yes Gabrella Stroh, [place], MD  traMADol (ULTRAM) 50 MG tablet Take 1 tablet (50 mg total) by mouth every 6 (six) hours as needed. 07/15/20 07/15/21 Yes Naziya Hegwood, [place], MD  alendronate (FOSAMAX) 35 MG tablet Take 35 mg by mouth every 7 (seven) days. On Fridays. Take with a full glass of water on an empty stomach.    [provider]  amLODipine (NORVASC) 2.5 MG tablet Take 2.5 mg by mouth 2 (two) times daily.    [provider]  atorvastatin (LIPITOR) 40 MG tablet Take 40 mg by mouth daily.    [provider]  calcium carbonate (OSCAL) 1500 (600 Ca) MG TABS tablet Take by mouth 2 (two) times daily with a meal.    [provider]  ferrous gluconate (FERGON) 324 MG tablet Take 324 mg by mouth 2 (two) times daily with a meal.    [provider]  glipiZIDE (GLUCOTROL) 10 MG tablet Take 5 mg by mouth 2 (two) times daily before a meal. 1/2  tablet     [provider]  hydrochlorothiazide (HYDRODIURIL) 25 MG tablet Take 25 mg by mouth daily.     [provider]  lisinopril (PRINIVIL,ZESTRIL) 40 MG tablet Take 40 mg by mouth daily.     [provider]  metformin (FORTAMET) 1000 MG (OSM) 24 hr tablet Take 1,000 mg by mouth daily at 6 PM.     [provider]  metFORMIN (GLUCOPHAGE) 500 MG tablet Take 500 mg by mouth daily with breakfast.    [provider]  metoprolol succinate (TOPROL-XL) 25 MG 24 hr tablet Take 25 mg by mouth daily.    [provider]  promethazine (PHENERGAN) 25 MG tablet Take 12.5-25 mg by mouth every 8 (eight) hours as needed for nausea or vomiting. 1/2 - 1 tablet Patient not taking: Reported on 12/01/2019    [provider]  sitaGLIPtin (JANUVIA) 100 MG tablet Take 100 mg by mouth daily.     [provider]  timolol (TIMOPTIC) 0.25 % ophthalmic solution Place 1 drop into the right eye 2 (two) times daily.    [provider]  vitamin C (ASCORBIC ACID) 500 MG tablet Take 500 mg by mouth 2 (two) times daily.  [provider]    Allergies Shellfish allergy  Family History  Problem Relation Age of Onset  . Thyroid disease Mother   . Pulmonary embolism Father   . Diabetes Sister   . Hypertension Sister   . Lupus Sister   . Coronary artery disease Other        uncle  . Breast cancer Neg Hx     Social History Social History   Tobacco Use  . Smoking status: Former Smoker    Packs/day: 2.00    Years: 20.00    Pack years: 40.00    Types: Cigarettes    Quit date: 1985    Years since quitting: 37.4  . Smokeless tobacco: Never Used  Vaping Use  . Vaping Use: Never used  Substance Use Topics  . Alcohol use: Yes    Alcohol/week: 2.0 standard drinks    Types: 2 Glasses of wine per week    Comment: socially  . Drug use: Never    Review of Systems  Constitutional: Negative for fever. Eyes: Negative for visual  changes. ENT: Negative for sore throat. Neck: No neck pain  Cardiovascular: Negative for chest pain. Respiratory: Negative for shortness of breath. Gastrointestinal: Negative for abdominal pain, vomiting or diarrhea. Genitourinary: Negative for dysuria. Musculoskeletal: + back pain. Skin: Negative for rash. Neurological: Negative for headaches, weakness or numbness. Psych: No SI or HI  ____________________________________________   PHYSICAL EXAM:  VITAL SIGNS: ED Triage Vitals  Enc Vitals Group     BP 07/14/20 2142 (!) 173/83     Pulse Rate 07/14/20 2142 80     Resp 07/14/20 2142 18     Temp 07/14/20 2142 99 F (37.2 C)     Temp Source 07/14/20 2142 Oral     SpO2 07/14/20 2142 99 %     Weight 07/14/20 2142 136 lb (61.7 kg)     Height 07/14/20 2142 5' (1.524 m)     Head Circumference --      Peak Flow --      Pain Score 07/14/20 2158 10     Pain Loc --      Pain Edu? --      Excl. in [place]? --     Constitutional: Alert and oriented. Well appearing and in no apparent distress. HEENT:      Head: Normocephalic and atraumatic.         Eyes: Conjunctivae are normal. Sclera is non-icteric.       Mouth/Throat: Mucous membranes are moist.       Neck: Supple with no signs of meningismus. Cardiovascular: Regular rate and rhythm. No murmurs, gallops, or rubs. 2+ symmetrical distal pulses are present in all extremities. No JVD. Respiratory: Normal respiratory effort. Lungs are clear to auscultation bilaterally.  Gastrointestinal: Soft, non tender, and non distended with positive bowel sounds. No rebound or guarding. Genitourinary: No CVA tenderness. Musculoskeletal: No CT and L-spine tenderness. Neurologic: Normal speech and language. Face is symmetric.  Intact strength and sensation of bilateral lower extremity with 1+ DTRs, normal gait  skin: Skin is warm, dry and intact. No rash noted. Psychiatric: Mood and affect are normal. Speech and behavior are  normal.  ____________________________________________   LABS (all labs ordered are listed, but only abnormal results are displayed)  Labs Reviewed  URINALYSIS, COMPLETE (UACMP) WITH MICROSCOPIC - Abnormal; Notable for the following components:      Result Value   Color, Urine STRAW (*)    APPearance CLEAR (*)    Protein,  ur 100 (*)    Bacteria, UA RARE (*)    All other components within normal limits  CBC WITH DIFFERENTIAL/PLATELET - Abnormal; Notable for the following components:   WBC 15.5 (*)    RBC 3.75 (*)    Hemoglobin 10.8 (*)    HCT 33.5 (*)    Neutro Abs 11.9 (*)    Abs Immature Granulocytes 0.08 (*)    All other components within normal limits  BASIC METABOLIC PANEL - Abnormal; Notable for the following components:   Sodium 133 (*)    Chloride 95 (*)    Glucose, Bld 132 (*)    BUN 24 (*)    All other components within normal limits  D-DIMER, QUANTITATIVE - Abnormal; Notable for the following components:   D-Dimer, Quant 3.44 (*)    All other components within normal limits   ____________________________________________  EKG  ED ECG REPORT I, Rudene Re, the attending physician, personally viewed and interpreted this ECG.  Sinus rhythm with PACs, rate of 69, normal intervals, normal axis, no ST elevations or depressions. ____________________________________________  RADIOLOGY  I have personally reviewed the images performed during this visit and I agree with the Radiologist's read.   Interpretation by Radiologist:  DG Lumbar Spine 2-3 Views  Result Date: 07/14/2020 CLINICAL DATA:  Back pain since yesterday.  No known injury. EXAM: LUMBAR SPINE - 2-3 VIEW COMPARISON:  Lumbar MRI 09/23/2017 FINDINGS: Mild broad-based levo scoliotic curvature. Posterior fusion L3 through L5 with interbody spacers. Intact hardware. No periprosthetic lucency or fracture. 7 mm anterolisthesis of L4 on L5 is not significantly changed. Vertebral body heights are normal. No  evidence of fracture, focal bone lesion or bone destruction. Mild L5-S1 disc space narrowing with endplate spurring. Facet hypertrophy at L5-S1 and L2-L3. The sacroiliac joints are congruent. IMPRESSION: 1. Posterior fusion L3 through L5. Stable 7 mm anterolisthesis of L4 on L5. No hardware complication or acute osseous abnormality. 2. Mild L5-S1 degenerative disc disease. Facet hypertrophy at L2-L3 and L5-S1. Electronically Signed   By: Keith Rake M.D.   On: 07/14/2020 22:46   CT Angio Chest/Abd/Pel for Dissection W and/or Wo Contrast  Result Date: 07/15/2020 CLINICAL DATA:  Abdominal pain with aortic dissection suspected EXAM: CT ANGIOGRAPHY CHEST, ABDOMEN AND PELVIS TECHNIQUE: Non-contrast CT of the chest was initially obtained. Multidetector CT imaging through the chest, abdomen and pelvis was performed using the standard protocol during bolus administration of intravenous contrast. Multiplanar reconstructed images and MIPs were obtained and reviewed to evaluate the vascular anatomy. CONTRAST:  130mL OMNIPAQUE IOHEXOL 350 MG/ML SOLN COMPARISON:  Abdomen and pelvis CT 06/19/2015 FINDINGS: CTA CHEST FINDINGS Cardiovascular: Preferential opacification of the thoracic aorta. No evidence of thoracic aortic aneurysm or dissection. Normal heart size. No pericardial effusion. Mediastinum/Nodes: Negative for adenopathy or pneumomediastinum. Lungs/Pleura: Streaky opacity at the lung bases. There is no edema, consolidation, effusion, or pneumothorax. Musculoskeletal: Posterior left eighth rib fracture with mild displacement. Generalized thoracic disc space collapse with small calcified protrusions and exaggerated kyphosis. Generalized degenerative facet spurring. Review of the MIP images confirms the above findings. CTA ABDOMEN AND PELVIS FINDINGS VASCULAR Aorta: Atheromatous plaque.  No aneurysm or dissection. Celiac: Accessory right hepatic artery from the SMA. No stenosis, aneurysm, or occlusion. SMA:  Unremarkable. Renals: Proximal atheromatous plaque. No stenosis, beading, or aneurysm. Accessory lower pole renal artery on the right. IMA: Patent Inflow: Atheromatous plaque without stenosis or dissection. Veins: Negative Review of the MIP images confirms the above findings. NON-VASCULAR Hepatobiliary: No focal liver abnormality.No  evidence of biliary obstruction or stone. Pancreas: Unremarkable. Spleen: Unremarkable. Adrenals/Urinary Tract: Negative adrenals. No hydronephrosis or stone. Small bilateral renal cysts. Unremarkable bladder. Stomach/Bowel: No obstruction. Distal colonic diverticulosis. No visible bowel inflammation. Lymphatic: No mass or adenopathy. Reproductive:Hysterectomy. Other: No ascites or pneumoperitoneum. Musculoskeletal: No acute abnormalities. L3-4 and L4-5 PLIF. Prominent cage subsidence and into the L3 inferior endplate. No acute fracture. Advanced L5-S1 degenerative disc collapse with left foraminal impingement. Review of the MIP images confirms the above findings. IMPRESSION: 1. Left posterior eighth rib fracture. 2. No evidence of acute aortic syndrome. 3. Moderate atelectasis. 4.  Aortic Atherosclerosis (ICD10-I70.0). 5. Distal colonic diverticulosis. Electronically Signed   By: Monte Fantasia M.D.   On: 07/15/2020 05:25     ____________________________________________   PROCEDURES  Procedure(s) performed:yes .1-3 Lead EKG Interpretation Performed by: Rudene Re, MD Authorized by: Rudene Re, MD     Interpretation: non-specific     ECG rate assessment: normal     Rhythm: sinus rhythm     Ectopy: PAC     Conduction: normal     Critical Care performed: yes  CRITICAL CARE Performed by: Rudene Re  ?  Total critical care time: 30 min  Critical care time was exclusive of separately billable procedures and treating other patients.  Critical care was necessary to treat or prevent imminent or life-threatening deterioration.  Critical  care was time spent personally by me on the following activities: development of treatment plan with patient and/or surrogate as well as nursing, discussions with consultants, evaluation of patient's response to treatment, examination of patient, obtaining history from patient or surrogate, ordering and performing treatments and interventions, ordering and review of laboratory studies, ordering and review of radiographic studies, pulse oximetry and re-evaluation of patient's condition.  ____________________________________________   INITIAL IMPRESSION / ASSESSMENT AND PLAN / ED COURSE  80 y.o. female history of diabetes, diverticulitis, hypertension, kidney stones, GI bleed discectomy of the lumbar spine and spinal fusion of L3-L5 in 2018 who presents for evaluation of back pain.  Patient points to the left flank and left lower back at the location of her pain.  There is no rash, there is no midline tenderness, the pain is not reproducible with palpation of the back, abdomen is soft and nontender, neurovascular exam of the lower extremities is normal.  Differential diagnosis including musculoskeletal pain versus UTI versus pyelonephritis versus kidney stone versus AAA versus dissection versus PE.  No hypoxia, tachycardia, tachypnea.  D-dimer is elevated.  We will do a CT angio of the chest abdomen pelvis.  UA with no signs of UTI.  Lumbar x-ray showing no acute abnormalities.  Labs showing elevated white count with a left shift.  No significant electrolyte derangements.  Patient received 1 Percocet for pain.  Imaging is pending.  Old medical records review including notes from Dr. Izora Ribas who is patient's neurosurgeon    _________________________ 5:46 AM on 07/15/2020 -----------------------------------------  CT showing a left posterior eighth rib fracture with no pneumothorax and no pulmonary contusion.  No other acute findings.  Pain is well controlled.  Will discharge home with a Lidoderm  patch, Tylenol, tramadol as needed, incentive spirometer, and follow-up with PCP.  Discussed my standard return precautions for any signs of pneumonia.  Discussed using Dulcolax while on tramadol to prevent constipation.  _____________________________________________ Please note:  Patient was evaluated in Emergency Department today for the symptoms described in the history of present illness. Patient was evaluated in the context of the global COVID-19 pandemic, which necessitated  consideration that the patient might be at risk for infection with the SARS-CoV-2 virus that causes COVID-19. Institutional protocols and algorithms that pertain to the evaluation of patients at risk for COVID-19 are in a state of rapid change based on information released by regulatory bodies including the CDC and federal and state organizations. These policies and algorithms were followed during the patient's care in the ED.  Some ED evaluations and interventions may be delayed as a result of limited staffing during the pandemic.   [place] Controlled Substance Database was reviewed by me. ____________________________________________   FINAL CLINICAL IMPRESSION(S) / ED DIAGNOSES   Final diagnoses:  Closed fracture of one rib of left side, initial encounter      NEW MEDICATIONS STARTED DURING THIS VISIT:  ED Discharge Orders         Ordered    acetaminophen (TYLENOL) 500 MG tablet  Every 8 hours PRN        07/15/20 0545    traMADol (ULTRAM) 50 MG tablet  Every 6 hours PRN        07/15/20 0545    bisacodyl (DULCOLAX) 5 MG EC tablet  Daily PRN        07/15/20 0545           Note:  This document was prepared using Dragon voice recognition software and may include unintentional dictation errors.    Rudene Re, MD 07/15/20 [phone]

## 2020-07-15 NOTE — Discharge Instructions (Signed)
Use the incentive spirometer as much as you can during the day to prevent pneumonia from developing.  For pain take 1000 mg of Tylenol every 8 hours.  You may take 50 mg tramadol every 4-6 hours for breakthrough pain.  If you take tramadol make sure to take the Dulcolax which is over-the-counter to prevent constipation.  Follow-up with your primary care doctor.  Return to the emergency room for chest pain, shortness of breath, cough or fever.

## 2020-07-19 ENCOUNTER — Other Ambulatory Visit: Payer: Self-pay | Admitting: Internal Medicine

## 2020-07-19 DIAGNOSIS — I10 Essential (primary) hypertension: Secondary | ICD-10-CM | POA: Diagnosis not present

## 2020-07-19 DIAGNOSIS — M810 Age-related osteoporosis without current pathological fracture: Secondary | ICD-10-CM | POA: Diagnosis not present

## 2020-07-19 DIAGNOSIS — D649 Anemia, unspecified: Secondary | ICD-10-CM | POA: Diagnosis not present

## 2020-07-19 DIAGNOSIS — M79662 Pain in left lower leg: Secondary | ICD-10-CM | POA: Diagnosis not present

## 2020-07-19 DIAGNOSIS — E119 Type 2 diabetes mellitus without complications: Secondary | ICD-10-CM | POA: Diagnosis not present

## 2020-07-19 DIAGNOSIS — R0789 Other chest pain: Secondary | ICD-10-CM | POA: Diagnosis not present

## 2020-07-19 DIAGNOSIS — Z1231 Encounter for screening mammogram for malignant neoplasm of breast: Secondary | ICD-10-CM

## 2020-07-19 DIAGNOSIS — Z8781 Personal history of (healed) traumatic fracture: Secondary | ICD-10-CM | POA: Diagnosis not present

## 2020-07-19 DIAGNOSIS — E785 Hyperlipidemia, unspecified: Secondary | ICD-10-CM | POA: Diagnosis not present

## 2020-07-31 ENCOUNTER — Other Ambulatory Visit: Payer: Self-pay | Admitting: Internal Medicine

## 2020-07-31 DIAGNOSIS — N6320 Unspecified lump in the left breast, unspecified quadrant: Secondary | ICD-10-CM

## 2020-08-03 ENCOUNTER — Ambulatory Visit
Admission: RE | Admit: 2020-08-03 | Discharge: 2020-08-03 | Disposition: A | Discharge status: Home / Self Care | Payer: Medicare HMO | Source: Ambulatory Visit | Attending: Internal Medicine | Admitting: Internal Medicine

## 2020-08-03 ENCOUNTER — Other Ambulatory Visit: Payer: Self-pay

## 2020-08-03 DIAGNOSIS — N6122 Granulomatous mastitis, left breast: Secondary | ICD-10-CM | POA: Diagnosis not present

## 2020-08-03 DIAGNOSIS — N6320 Unspecified lump in the left breast, unspecified quadrant: Secondary | ICD-10-CM

## 2020-08-03 DIAGNOSIS — N644 Mastodynia: Secondary | ICD-10-CM | POA: Diagnosis not present

## 2020-08-03 DIAGNOSIS — R922 Inconclusive mammogram: Secondary | ICD-10-CM | POA: Diagnosis not present

## 2020-08-10 ENCOUNTER — Other Ambulatory Visit: Payer: Self-pay

## 2020-08-10 ENCOUNTER — Ambulatory Visit (INDEPENDENT_AMBULATORY_CARE_PROVIDER_SITE_OTHER): Payer: Medicare HMO | Admitting: Dermatology

## 2020-08-10 DIAGNOSIS — L918 Other hypertrophic disorders of the skin: Secondary | ICD-10-CM | POA: Diagnosis not present

## 2020-08-10 DIAGNOSIS — L820 Inflamed seborrheic keratosis: Secondary | ICD-10-CM | POA: Diagnosis not present

## 2020-08-10 DIAGNOSIS — D485 Neoplasm of uncertain behavior of skin: Secondary | ICD-10-CM | POA: Diagnosis not present

## 2020-08-10 DIAGNOSIS — L821 Other seborrheic keratosis: Secondary | ICD-10-CM | POA: Diagnosis not present

## 2020-08-10 DIAGNOSIS — B079 Viral wart, unspecified: Secondary | ICD-10-CM | POA: Diagnosis not present

## 2020-08-10 DIAGNOSIS — D489 Neoplasm of uncertain behavior, unspecified: Secondary | ICD-10-CM

## 2020-08-10 NOTE — Progress Notes (Signed)
   New Patient Visit  Subjective  Jordan Small is a 80 y.o. female who presents for the following: Skin Problem (Patient has a spot at left jaw that has been there for at least a few months. Patient advises she hit it and some of it came off and bled. ).  Patient also has a spot between breasts and one at left thigh that she would like checked. Both present for quite some time and not bothersome for patient.   The following portions of the chart were reviewed this encounter and updated as appropriate:   Tobacco  Allergies  Meds  Problems  Med Hx  Surg Hx  Fam Hx       Review of Systems:  No other skin or systemic complaints except as noted in HPI or Assessment and Plan.  Objective  Well appearing patient in no apparent distress; mood and affect are within normal limits.  A focused examination was performed including left thigh, chest, neck, face. Relevant physical exam findings are noted in the Assessment and Plan.  Left Thigh x 1, mid chest x 1 (2) Erythematous keratotic or waxy stuck-on papule or plaque.   Left neck 0.5cm papillated papule    Assessment & Plan  Inflamed seborrheic keratosis Left Thigh x 1, mid chest x 1  Prior to procedure, discussed risks of blister formation, small wound, skin dyspigmentation, or rare scar following cryotherapy. Recommend Vaseline ointment to treated areas while healing.  Vs Acrochordon at mid chest  Destruction of lesion - Left Thigh x 1, mid chest x 1  Destruction method: cryotherapy   Informed consent: discussed and consent obtained   Lesion destroyed using liquid nitrogen: Yes   Cryotherapy cycles:  2 Outcome: patient tolerated procedure well with no complications   Post-procedure details: wound care instructions given    Neoplasm of uncertain behavior Left neck  Epidermal / dermal shaving  Lesion diameter (cm):  0.5 Informed consent: discussed and consent obtained   Timeout: patient name, date of birth, surgical  site, and procedure verified   Patient was prepped and draped in usual sterile fashion: area prepped with isopropyl alcohol. Anesthesia: the lesion was anesthetized in a standard fashion   Anesthetic:  1% lidocaine w/ epinephrine 1-100,000 buffered w/ 8.4% NaHCO3 Instrument used: flexible razor blade   Hemostasis achieved with: aluminum chloride   Outcome: patient tolerated procedure well   Post-procedure details: wound care instructions given   Additional details:  Mupirocin and a bandage applied  Specimen 1 - Surgical pathology Differential Diagnosis: verruca vs other  Check Margins: No 0.5cm verrucous papillated papule    Acrochordons (Skin Tags) - Fleshy, skin-colored pedunculated papules - Benign appearing.  - Observe. - If desired, they can be removed with an in office procedure that is not covered by insurance. - Please call the clinic if you notice any new or changing lesions.  Seborrheic Keratoses - Stuck-on, waxy, tan-brown papules and/or plaques  - Benign-appearing - Discussed benign etiology and prognosis. - Observe - Call for any changes  Return if symptoms worsen or fail to improve.  Graciella Belton, RMA, am acting as scribe for Forest Gleason, MD .   Documentation: I have reviewed the above documentation for accuracy and completeness, and I agree with the above.  Forest Gleason, MD

## 2020-08-10 NOTE — Patient Instructions (Addendum)
Cryotherapy Aftercare  Wash gently with soap and water everyday.   Apply Vaseline and Band-Aid daily until healed.   Prior to procedure, discussed risks of blister formation, small wound, skin dyspigmentation, or rare scar following cryotherapy. Recommend Vaseline ointment to treated areas while healing.  Wound Care Instructions  Cleanse wound gently with soap and water once a day then pat dry with clean gauze. Apply a thing coat of Petrolatum (petroleum jelly, "Vaseline") over the wound (unless you have an allergy to this). We recommend that you use a new, sterile tube of Vaseline. Do not pick or remove scabs. Do not remove the yellow or white "healing tissue" from the base of the wound.  Cover the wound with fresh, clean, nonstick gauze and secure with paper tape. You may use Band-Aids in place of gauze and tape if the would is small enough, but would recommend trimming much of the tape off as there is often too much. Sometimes Band-Aids can irritate the skin.  You should call the office for your biopsy report after 1 week if you have not already been contacted.  If you experience any problems, such as abnormal amounts of bleeding, swelling, significant bruising, significant pain, or evidence of infection, please call the office immediately.  FOR ADULT SURGERY PATIENTS: If you need something for pain relief you may take 1 extra strength Tylenol (acetaminophen) AND 2 Ibuprofen (200mg  each) together every 4 hours as needed for pain. (do not take these if you are allergic to them or if you have a reason you should not take them.) Typically, you may only need pain medication for 1 to 3 days.

## 2020-08-11 DIAGNOSIS — S2232XA Fracture of one rib, left side, initial encounter for closed fracture: Secondary | ICD-10-CM | POA: Diagnosis not present

## 2020-08-11 DIAGNOSIS — S29001A Unspecified injury of muscle and tendon of front wall of thorax, initial encounter: Secondary | ICD-10-CM | POA: Diagnosis not present

## 2020-08-11 DIAGNOSIS — I517 Cardiomegaly: Secondary | ICD-10-CM | POA: Diagnosis not present

## 2020-08-15 DIAGNOSIS — R42 Dizziness and giddiness: Secondary | ICD-10-CM | POA: Diagnosis not present

## 2020-08-15 DIAGNOSIS — R809 Proteinuria, unspecified: Secondary | ICD-10-CM | POA: Diagnosis not present

## 2020-08-15 DIAGNOSIS — E1122 Type 2 diabetes mellitus with diabetic chronic kidney disease: Secondary | ICD-10-CM | POA: Diagnosis not present

## 2020-08-15 DIAGNOSIS — I129 Hypertensive chronic kidney disease with stage 1 through stage 4 chronic kidney disease, or unspecified chronic kidney disease: Secondary | ICD-10-CM | POA: Diagnosis not present

## 2020-08-15 DIAGNOSIS — E7800 Pure hypercholesterolemia, unspecified: Secondary | ICD-10-CM | POA: Diagnosis not present

## 2020-08-15 DIAGNOSIS — R112 Nausea with vomiting, unspecified: Secondary | ICD-10-CM | POA: Diagnosis not present

## 2020-08-15 DIAGNOSIS — N182 Chronic kidney disease, stage 2 (mild): Secondary | ICD-10-CM | POA: Diagnosis not present

## 2020-08-15 DIAGNOSIS — E1129 Type 2 diabetes mellitus with other diabetic kidney complication: Secondary | ICD-10-CM | POA: Diagnosis not present

## 2020-08-16 ENCOUNTER — Encounter: Payer: Self-pay | Admitting: Dermatology

## 2020-08-16 ENCOUNTER — Telehealth: Payer: Self-pay

## 2020-08-16 NOTE — Telephone Encounter (Signed)
Patient notified of biopsy results and recommendations. Patient denied further questions and states she will give [place] a call back to schedule appointment if she needs treatment.

## 2020-08-16 NOTE — Telephone Encounter (Signed)
-----   Message from Alfonso Patten, MD sent at 08/15/2020 12:01 AM EDT ----- Skin , left neck VERRUCA VULGARIS, IRRITATED, BASE INVOLVED  This is a WART caused by the human papilloma virus. It is not dangerous but is contagious and can spread to other areas of skin or other people if it is not completely gone. No additional treatment is needed. However, if it comes back, we can freeze it in clinic with liquid nitrogen (a quick in office procedure) or you can also treat it at home with an over the counter salicylic wart treatment (slower).  Please call the office at [phone] or message [place] if you have have any questions.  MAs please call. Thank you!

## 2020-08-29 DIAGNOSIS — E785 Hyperlipidemia, unspecified: Secondary | ICD-10-CM | POA: Diagnosis not present

## 2020-08-29 DIAGNOSIS — E119 Type 2 diabetes mellitus without complications: Secondary | ICD-10-CM | POA: Diagnosis not present

## 2020-08-29 DIAGNOSIS — Z0000 Encounter for general adult medical examination without abnormal findings: Secondary | ICD-10-CM | POA: Diagnosis not present

## 2020-08-29 DIAGNOSIS — D649 Anemia, unspecified: Secondary | ICD-10-CM | POA: Diagnosis not present

## 2020-08-29 DIAGNOSIS — I10 Essential (primary) hypertension: Secondary | ICD-10-CM | POA: Diagnosis not present

## 2020-08-29 DIAGNOSIS — I350 Nonrheumatic aortic (valve) stenosis: Secondary | ICD-10-CM | POA: Diagnosis not present

## 2020-08-29 DIAGNOSIS — Z1389 Encounter for screening for other disorder: Secondary | ICD-10-CM | POA: Diagnosis not present

## 2020-09-19 DIAGNOSIS — H401111 Primary open-angle glaucoma, right eye, mild stage: Secondary | ICD-10-CM | POA: Diagnosis not present

## 2020-11-06 DIAGNOSIS — Z23 Encounter for immunization: Secondary | ICD-10-CM | POA: Diagnosis not present

## 2020-11-29 ENCOUNTER — Inpatient Hospital Stay: Payer: Medicare HMO | Attending: Obstetrics and Gynecology

## 2020-12-15 ENCOUNTER — Telehealth: Payer: Self-pay

## 2020-12-15 NOTE — Telephone Encounter (Signed)
Did not show for her 11/29/20 gyn oncology appointment. Message sent to scheduling to contact her regarding rescheduling.

## 2020-12-19 ENCOUNTER — Telehealth: Payer: Self-pay | Admitting: Obstetrics and Gynecology

## 2020-12-19 NOTE — Telephone Encounter (Signed)
Left Vm with patient to reschedule her missed appointment with Dr. Theora Gianotti on 10/12.

## 2021-01-08 DIAGNOSIS — I350 Nonrheumatic aortic (valve) stenosis: Secondary | ICD-10-CM | POA: Diagnosis not present

## 2021-01-08 DIAGNOSIS — E1129 Type 2 diabetes mellitus with other diabetic kidney complication: Secondary | ICD-10-CM | POA: Diagnosis not present

## 2021-01-08 DIAGNOSIS — R809 Proteinuria, unspecified: Secondary | ICD-10-CM | POA: Diagnosis not present

## 2021-01-08 DIAGNOSIS — D649 Anemia, unspecified: Secondary | ICD-10-CM | POA: Diagnosis not present

## 2021-01-08 DIAGNOSIS — E7800 Pure hypercholesterolemia, unspecified: Secondary | ICD-10-CM | POA: Diagnosis not present

## 2021-01-08 DIAGNOSIS — I10 Essential (primary) hypertension: Secondary | ICD-10-CM | POA: Diagnosis not present

## 2021-01-08 DIAGNOSIS — Z0000 Encounter for general adult medical examination without abnormal findings: Secondary | ICD-10-CM | POA: Diagnosis not present

## 2021-01-08 DIAGNOSIS — Z79899 Other long term (current) drug therapy: Secondary | ICD-10-CM | POA: Diagnosis not present

## 2021-01-17 DIAGNOSIS — I10 Essential (primary) hypertension: Secondary | ICD-10-CM | POA: Diagnosis not present

## 2021-01-17 DIAGNOSIS — Z7984 Long term (current) use of oral hypoglycemic drugs: Secondary | ICD-10-CM | POA: Diagnosis not present

## 2021-01-17 DIAGNOSIS — E119 Type 2 diabetes mellitus without complications: Secondary | ICD-10-CM | POA: Diagnosis not present

## 2021-01-17 DIAGNOSIS — E785 Hyperlipidemia, unspecified: Secondary | ICD-10-CM | POA: Diagnosis not present

## 2021-02-02 DIAGNOSIS — Z01419 Encounter for gynecological examination (general) (routine) without abnormal findings: Secondary | ICD-10-CM | POA: Diagnosis not present

## 2021-02-02 DIAGNOSIS — N890 Mild vaginal dysplasia: Secondary | ICD-10-CM | POA: Diagnosis not present

## 2021-02-02 DIAGNOSIS — R87622 Low grade squamous intraepithelial lesion on cytologic smear of vagina (LGSIL): Secondary | ICD-10-CM | POA: Diagnosis not present

## 2021-02-02 DIAGNOSIS — R87610 Atypical squamous cells of undetermined significance on cytologic smear of cervix (ASC-US): Secondary | ICD-10-CM | POA: Diagnosis not present

## 2021-02-02 DIAGNOSIS — N893 Dysplasia of vagina, unspecified: Secondary | ICD-10-CM | POA: Diagnosis not present

## 2021-02-02 DIAGNOSIS — Z0142 Encounter for cervical smear to confirm findings of recent normal smear following initial abnormal smear: Secondary | ICD-10-CM | POA: Diagnosis not present

## 2021-03-08 DIAGNOSIS — H401111 Primary open-angle glaucoma, right eye, mild stage: Secondary | ICD-10-CM | POA: Diagnosis not present

## 2021-03-19 DIAGNOSIS — H40002 Preglaucoma, unspecified, left eye: Secondary | ICD-10-CM | POA: Diagnosis not present

## 2021-05-10 DIAGNOSIS — M5136 Other intervertebral disc degeneration, lumbar region: Secondary | ICD-10-CM | POA: Diagnosis not present

## 2021-05-10 DIAGNOSIS — R809 Proteinuria, unspecified: Secondary | ICD-10-CM | POA: Diagnosis not present

## 2021-05-10 DIAGNOSIS — I10 Essential (primary) hypertension: Secondary | ICD-10-CM | POA: Diagnosis not present

## 2021-05-10 DIAGNOSIS — E1129 Type 2 diabetes mellitus with other diabetic kidney complication: Secondary | ICD-10-CM | POA: Diagnosis not present

## 2021-05-10 DIAGNOSIS — E785 Hyperlipidemia, unspecified: Secondary | ICD-10-CM | POA: Diagnosis not present

## 2021-05-17 DIAGNOSIS — M810 Age-related osteoporosis without current pathological fracture: Secondary | ICD-10-CM | POA: Diagnosis not present

## 2021-05-17 DIAGNOSIS — E119 Type 2 diabetes mellitus without complications: Secondary | ICD-10-CM | POA: Diagnosis not present

## 2021-05-17 DIAGNOSIS — D649 Anemia, unspecified: Secondary | ICD-10-CM | POA: Diagnosis not present

## 2021-05-17 DIAGNOSIS — I10 Essential (primary) hypertension: Secondary | ICD-10-CM | POA: Diagnosis not present

## 2021-05-17 DIAGNOSIS — Z1231 Encounter for screening mammogram for malignant neoplasm of breast: Secondary | ICD-10-CM | POA: Diagnosis not present

## 2021-05-22 ENCOUNTER — Other Ambulatory Visit: Payer: Self-pay | Admitting: Internal Medicine

## 2021-05-22 DIAGNOSIS — N6320 Unspecified lump in the left breast, unspecified quadrant: Secondary | ICD-10-CM

## 2021-05-23 ENCOUNTER — Other Ambulatory Visit: Payer: Self-pay | Admitting: Internal Medicine

## 2021-05-23 DIAGNOSIS — Z1231 Encounter for screening mammogram for malignant neoplasm of breast: Secondary | ICD-10-CM

## 2021-05-25 ENCOUNTER — Telehealth: Payer: Self-pay

## 2021-05-25 NOTE — Telephone Encounter (Signed)
Called and spoke with Jordan Small. Appointment scheduled with gyn oncology. ?

## 2021-06-13 ENCOUNTER — Inpatient Hospital Stay: Payer: Medicare HMO | Attending: Obstetrics and Gynecology | Admitting: Obstetrics and Gynecology

## 2021-06-13 VITALS — BP 131/70 | HR 73 | Temp 98.7°F | Resp 20 | Wt 125.9 lb

## 2021-06-13 DIAGNOSIS — N890 Mild vaginal dysplasia: Secondary | ICD-10-CM | POA: Diagnosis not present

## 2021-06-13 DIAGNOSIS — Z87891 Personal history of nicotine dependence: Secondary | ICD-10-CM | POA: Diagnosis not present

## 2021-06-13 DIAGNOSIS — Z90710 Acquired absence of both cervix and uterus: Secondary | ICD-10-CM | POA: Diagnosis not present

## 2021-06-13 DIAGNOSIS — G8929 Other chronic pain: Secondary | ICD-10-CM | POA: Diagnosis not present

## 2021-06-13 DIAGNOSIS — I10 Essential (primary) hypertension: Secondary | ICD-10-CM | POA: Diagnosis not present

## 2021-06-13 DIAGNOSIS — N893 Dysplasia of vagina, unspecified: Secondary | ICD-10-CM | POA: Diagnosis not present

## 2021-06-13 DIAGNOSIS — M549 Dorsalgia, unspecified: Secondary | ICD-10-CM | POA: Insufficient documentation

## 2021-06-13 DIAGNOSIS — E119 Type 2 diabetes mellitus without complications: Secondary | ICD-10-CM | POA: Diagnosis not present

## 2021-06-13 DIAGNOSIS — R87622 Low grade squamous intraepithelial lesion on cytologic smear of vagina (LGSIL): Secondary | ICD-10-CM | POA: Diagnosis not present

## 2021-06-13 NOTE — Progress Notes (Addendum)
Gynecologic Oncology Interval Visit   Referring Provider: Dr. Leafy Ro  Chief Complaint: VAIN and LGSIL on vaginal pap  Subjective:  Jordan Small is a 81 y.o. 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   Colposcopy 12/01/2019: Area of AWE on the posterior wall of the vagina ~ 4 cm distal from the cuff.  Biopsies were not performed.   She presents today for evaluation. She has chronic back pain.    Gynecologic Oncology History  -S/p TVH for abnormal bleeding in her 51s.  - 05/2015 pap with LGSIL and +HPV; colpo with vaginal Bx, neg - 07/2016 pap with LGSIL with +HPV; colpo with vaginal cuff Bx, neg - 07/2017 pap with LGSIL; no biopsy - 05/2018 pap with insufficient cells --->vaginal estrogen and repeat in 06/2018: LGSIL/ no HPV tested - 07/2018 colpo with bx: LGSIL (CIN I), consistent with VAIN1 - 08/19/2019 Pap ASCUS - endocervical cell noted on Pap - 10/19/2019 Colposcopy +ACE at 6 o'clock but neg [place]. 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   LGSIL Pap smear of vagina 12/01/2019   Lumbar radiculitis 10/30/2016    Past Medical History: Past Medical History:  Diagnosis Date   Anemia, unspecified    Arthritis    Diabetes mellitus without complication (HCC)    Disorder of bursae and tendons in shoulder region    unspecified   Diverticulitis    had a GI bleed with this in the past   Diverticulosis    Essential hypertension, benign    GERD (gastroesophageal reflux disease)    Heart murmur    followed by PCP   History of GI bleed    Hypertension    Mitral valve disorder    Nephrolithiasis    Osteoporosis    Pure hypercholesterolemia    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 ([place])    unspecified type diabetes without mention of complication, not stated as uncontrolled    Past Surgical  History: Past Surgical History:  Procedure Laterality Date   ABDOMINAL HYSTERECTOMY     ANTERIOR THORACIC SPINE FUSION MULTIPLE LEVELS  10/08/2016   Procedure: ARTHRODESIS, ANTERIOR INTERBODY TECHNIQUE, INCLUDING MINIMAL DISCECTOMY TO PREPARE INTERSPACE (OTHER THAN FOR DECOMPRESSION); LUMBAR; Surgeon: Ricard Dillon, MD; Location: [facility]; Service: Neurosurgery; Laterality: N/A;   ARTHRODESIS ANTEROR CERVICAL SPINE  10/08/2016   Procedure: ARTHRODESIS, ANTERIOR INTERBODY TECHNIQUE, INCLUDING MINIMAL DISCECTOMY TO PREPARE INTERSPACE (OTHER THAN DECOMPRESSION); EACH ADDITIONAL INTERSPACE (LIST IN ADDITION TO CODE FOR PRIMARY PROCEDURE); Surgeon: Ricard Dillon, MD; Location: [facility]; Service: Neurosurgery; Laterality: N/A;   BREAST EXCISIONAL BIOPSY Left 1970   neg   CATARACT EXTRACTION W/PHACO Right 10/18/2015   Procedure: CATARACT EXTRACTION PHACO AND INTRAOCULAR LENS PLACEMENT (IOC);  Surgeon: Leandrew Koyanagi, MD;  Location: [facility];  Service: Ophthalmology;  Laterality: Right;  DIABETIC - oral meds KEEP PT TIME AFTER 8 PER PT KEEP 5TH   COLONOSCOPY WITH PROPOFOL N/A 07/18/2017   Procedure: COLONOSCOPY WITH PROPOFOL;  Surgeon: Manya Silvas, MD;  [facility] ENDOSCOPY;  Service: Endoscopy;  Laterality: N/A;   COLPOSCOPY  2017   vag colp-neg TVH   INSTRUMENTATION POSTERIOR SPINE 3 TO 6 VERTERAL SEGMENTS  10/08/2016   Procedure: POSTERIOR SEGMENTAL INSTRUMENTATION (EG, PEDICLE FIXATION, DUAL RODS WITH MULTIPLE HOOKS AND SUBLAMINAR WIRES); 3 TO 6 VERTEBRAL SEGMENTS (LIST IN ADDITION TO PRIMARY PROCEDURE);  Surgeon: Ricard Dillon, MD; Location: [facility]; Service: Neurosurgery; Laterality: N/A;   PARTIAL HYSTERECTOMY     in the 70's   POSTERIOR LUMBAR SPINE FUSION ONE LEVEL LATERAL TRANSVERSE TECHNIQUE  10/08/2016   Procedure: ARTHRODESIS, POSTERIOR OR POSTEROLATERAL TECHNIQUE, SINGLE LEVEL; LUMBAR (WITH LATERAL TRANSVERSE TECHNIQUE, WHEN  PERFORMED); Surgeon: Ricard Dillon, MD; Location: [facility]; Service: Neurosurgery; Laterality: N/A;   STRESS FRACTURE OF RIGHT FOOT     TOTAL VAGINAL HYSTERECTOMY      Past Gynecologic History:  Post-menopausal Contraception: none Sexually active: not sexually active  OB History:  OB History  Gravida Para Term Preterm AB Living  '3 3 3        '$ SAB IAB Ectopic Multiple Live Births               # Outcome Date GA Lbr Len/2nd Weight Sex Delivery Anes PTL Lv  3 Term           2 Term           1 Term             Family History: Family History  Problem Relation Age of Onset   Thyroid disease Mother    Pulmonary embolism Father    Diabetes Sister    Hypertension Sister    Lupus Sister    Coronary artery disease Other        uncle   Breast cancer Neg Hx     Social History: Social History   Socioeconomic History   Marital status: Divorced    Spouse name: Not on file   Number of children: 3   Years of education: college   Highest education level: Not on file  Occupational History    Comment: retired Community education officer  Tobacco Use   Smoking status: Former    Packs/day: 2.00    Years: 20.00    Pack years: 40.00    Types: Cigarettes    Quit date: 1985    Years since quitting: 38.3   Smokeless tobacco: Never  Vaping Use   Vaping Use: Never used  Substance and Sexual Activity   Alcohol use: Yes    Alcohol/week: 2.0 standard drinks    Types: 2 Glasses of wine per week    Comment: socially   Drug use: Never   Sexual activity: Not Currently  Other Topics Concern   Not on file  Social History Narrative   Former smoker   Drinks 2 glasses of wine socially   3 sons   Divorced   DNR   Social Determinants of Health   Financial Resource Strain: Not on file  Food Insecurity: Not on file  Transportation Needs: Not on file  Physical Activity: Not on file  Stress: Not on file  Social Connections: Not on file  Intimate Partner Violence: Not on file     Allergies: Allergies  Allergen Reactions   Shellfish Allergy Nausea And Vomiting    Current Medications: Current Outpatient Medications  Medication Sig Dispense Refill   acetaminophen (TYLENOL) 500 MG tablet Take 2 tablets (1,000 mg total) by mouth every 8 (eight) hours as needed for mild pain, moderate pain, fever or headache. 50 tablet 0   alendronate (FOSAMAX) 35 MG tablet Take 35 mg by mouth every 7 (seven) days. On Fridays. Take with a full glass of water on an empty stomach.     amLODipine (NORVASC) 2.5 MG tablet Take 2.5 mg by mouth 2 (two) times daily.  atorvastatin (LIPITOR) 40 MG tablet Take 40 mg by mouth daily.     bisacodyl (DULCOLAX) 5 MG EC tablet Take 1 tablet (5 mg total) by mouth daily as needed for moderate constipation. 30 tablet 1   calcium carbonate (OSCAL) 1500 (600 Ca) MG TABS tablet Take by mouth 2 (two) times daily with a meal.     ferrous gluconate (FERGON) 324 MG tablet Take 324 mg by mouth 2 (two) times daily with a meal.     glipiZIDE (GLUCOTROL) 10 MG tablet Take 5 mg by mouth 2 (two) times daily before a meal. 1/2 tablet      hydrochlorothiazide (HYDRODIURIL) 25 MG tablet Take 25 mg by mouth daily.      lisinopril (PRINIVIL,ZESTRIL) 40 MG tablet Take 40 mg by mouth daily.      metformin (FORTAMET) 1000 MG (OSM) 24 hr tablet Take 1,000 mg by mouth daily at 6 PM.      metFORMIN (GLUCOPHAGE) 500 MG tablet Take 500 mg by mouth daily with breakfast.     metoprolol succinate (TOPROL-XL) 25 MG 24 hr tablet Take 25 mg by mouth daily.     sitaGLIPtin (JANUVIA) 100 MG tablet Take 100 mg by mouth daily.      timolol (TIMOPTIC) 0.25 % ophthalmic solution Place 1 drop into the right eye 2 (two) times daily.     vitamin C (ASCORBIC ACID) 500 MG tablet Take 500 mg by mouth 2 (two) times daily.     promethazine (PHENERGAN) 25 MG tablet Take 12.5-25 mg by mouth every 8 (eight) hours as needed for nausea or vomiting. 1/2 - 1 tablet (Patient not taking: Reported on  12/01/2019)     traMADol (ULTRAM) 50 MG tablet Take 1 tablet (50 mg total) by mouth every 6 (six) hours as needed. (Patient not taking: Reported on 06/13/2021) 12 tablet 0   No current facility-administered medications for this visit.   Review of Systems.  General: no complaints  HEENT: no complaints  Lungs: no complaints  Cardiac: no complaints  GI: no complaints  GU: no complaints  Musculoskeletal: chronic back pain o/w no complaints  Extremities: no complaints  Skin: no complaints  Neuro: no complaints  Endocrine: no complaints  Psych: no complaints        Objective:  Physical Examination:  BP 131/70   Pulse 73   Temp 98.7 F (37.1 C)   Resp 20   Wt 125 lb 14.4 oz (57.1 kg)   SpO2 100%   BMI 24.59 kg/m     ECOG Performance Status: 0 - Asymptomatic  GENERAL: Patient is a well appearing female in no acute distress HEENT:  PERRL, neck supple with midline trachea.  NODES:  No inguinal lymphadenopathy palpated.  ABDOMEN:  Soft, nontender, nondistended. No masses/ascites  EXTREMITIES:  No peripheral edema.   NEURO:  Nonfocal. Well oriented.  Appropriate affect.  Pelvic: EGBUS: no lesions Cervix: surgically absent Vagina: no lesions, no discharge or bleeding; Pap obtained Uterus: surgically absent BME:  no palpable masses   Lab Review Labs on site today: No labs on site today  Radiologic Imaging: No imaging on site today.     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 Negative exam today.   Medical co-morbidities complicating care: HTN Plan:   Problem List Items Addressed This Visit       Genitourinary   VAIN (vaginal intraepithelial neoplasia) - Primary   Relevant Orders   IGP, Aptima HPV  Repeat Pap and HPV testing today. Follow up results.   Guideline: For patients 25 years or older with histologic LSIL (CIN 1) who is diagnosed at consecutive visits for at least 2 years, observation is preferred (BII) but  treatment is acceptable (CIII).   Suggested return to clinic in  1 year if Pap/HPV negative  The patient's diagnosis, an outline of the further diagnostic and laboratory studies which will be required, the recommendation for surgery, and alternatives were discussed with her and her accompanying family members.  All questions were answered to their satisfaction.  A total of 20 minutes were spent with the patient/family today; >50% was spent in education, counseling and coordination of care for [place].  Herberto Ledwell Gaetana Michaelis, MD   ADDENDUM:  Pap Dr.Beasley 02/08/21 ASCUS    Discussed with team. Given HPV negative status and low grade changes on prior biopsy plan for repeat co-testing in one year.  Vishruth Seoane Gaetana Michaelis, MD    CC:  Benjaman Kindler, [address] [place],  [place] [address] [phone]

## 2021-06-25 ENCOUNTER — Telehealth: Payer: Self-pay

## 2021-06-25 ENCOUNTER — Encounter: Payer: Self-pay | Admitting: Nurse Practitioner

## 2021-06-25 NOTE — Telephone Encounter (Signed)
Call placed to [facility] to obtain pap smear results from 06/13/2021. Awaiting results to be faxed. ?

## 2021-06-26 ENCOUNTER — Telehealth: Payer: Self-pay

## 2021-06-26 NOTE — Telephone Encounter (Signed)
Results of pap smear sent to Dr. Theora Gianotti for review on 06/25/21. ?

## 2021-07-06 ENCOUNTER — Telehealth: Payer: Self-pay

## 2021-07-06 NOTE — Telephone Encounter (Signed)
Voicemail left with Jordan Small that she can return call for results of pap smear.

## 2021-07-09 ENCOUNTER — Telehealth: Payer: Self-pay

## 2021-07-09 NOTE — Telephone Encounter (Signed)
Notified Jordan Small with her pap smear results. She will keep her 1 year follow up as scheduled.

## 2021-08-06 ENCOUNTER — Ambulatory Visit
Admission: RE | Admit: 2021-08-06 | Discharge: 2021-08-06 | Disposition: A | Discharge status: Home / Self Care | Payer: Medicare HMO | Source: Ambulatory Visit | Attending: Internal Medicine | Admitting: Internal Medicine

## 2021-08-06 DIAGNOSIS — Z1231 Encounter for screening mammogram for malignant neoplasm of breast: Secondary | ICD-10-CM | POA: Diagnosis not present

## 2021-11-24 IMAGING — MG DIGITAL DIAGNOSTIC BILAT W/ TOMO W/ CAD
6 of 10 series · 6 of 30 positions shown · non-contrast
Comparison: Previous exam(s).

CLINICAL DATA: Painful reddened area in the LEFT breast.

EXAM:
DIGITAL DIAGNOSTIC BILATERAL MAMMOGRAM WITH TOMOSYNTHESIS AND CAD;
ULTRASOUND LEFT BREAST LIMITED
TECHNIQUE: Bilateral digital diagnostic mammography and breast tomosynthesis
was performed. The images were evaluated with computer-aided
detection.; Targeted ultrasound examination of the left breast was
performed

[L MLO synth-2D]
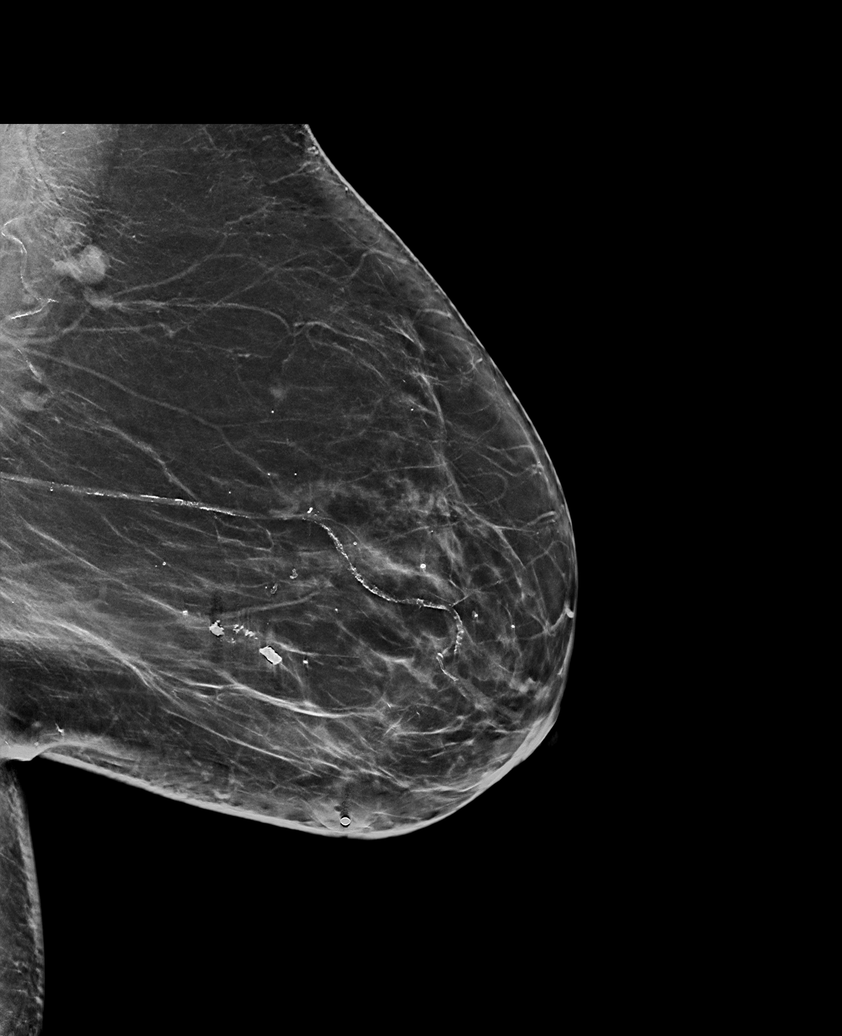

[L CC synth-2D]
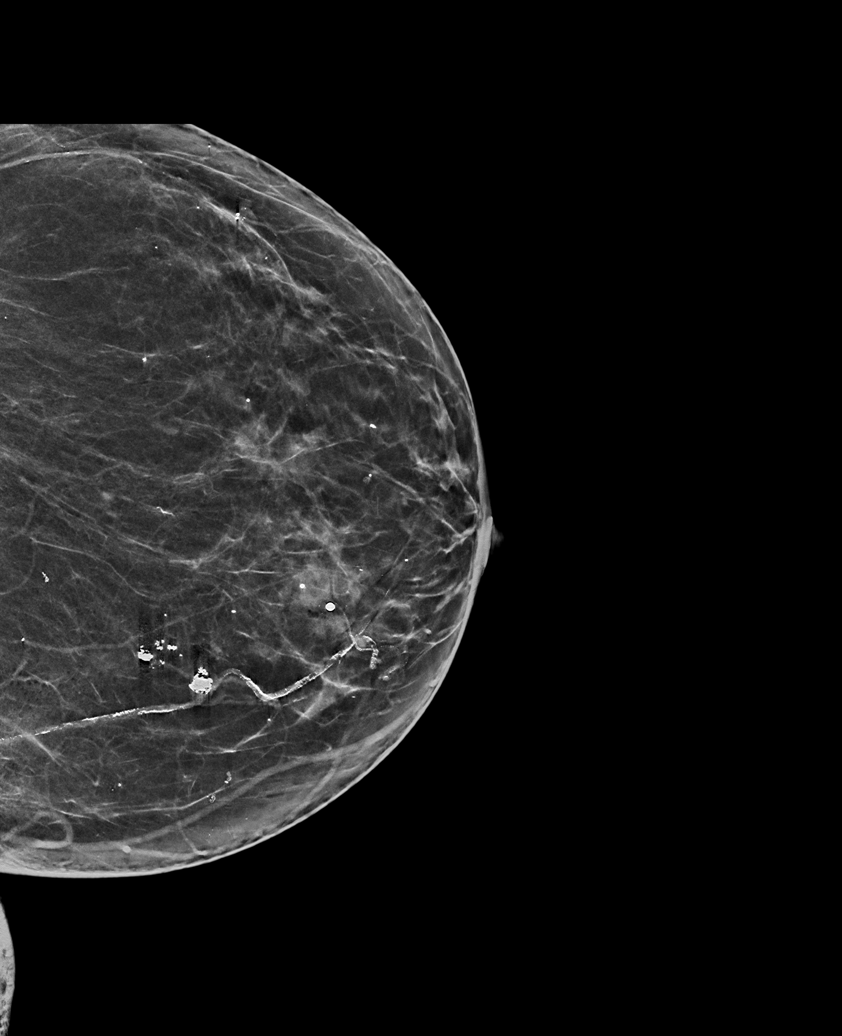

[R MLO synth-2D]
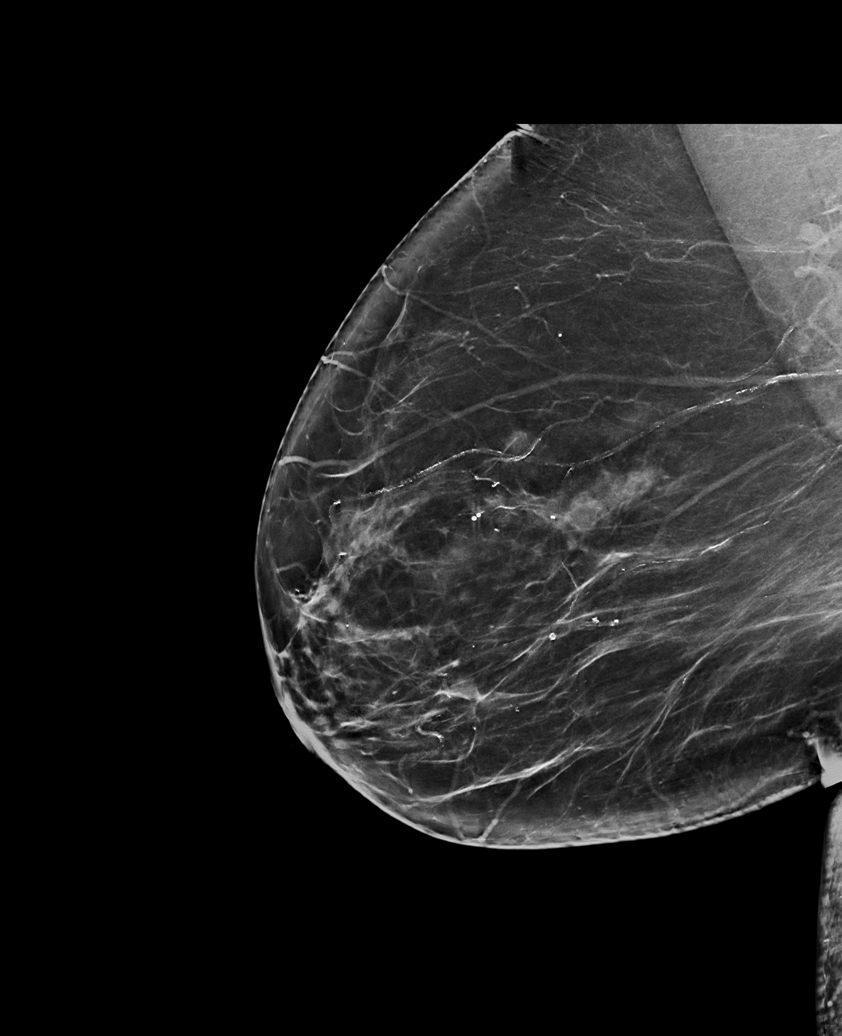

[R CC synth-2D]
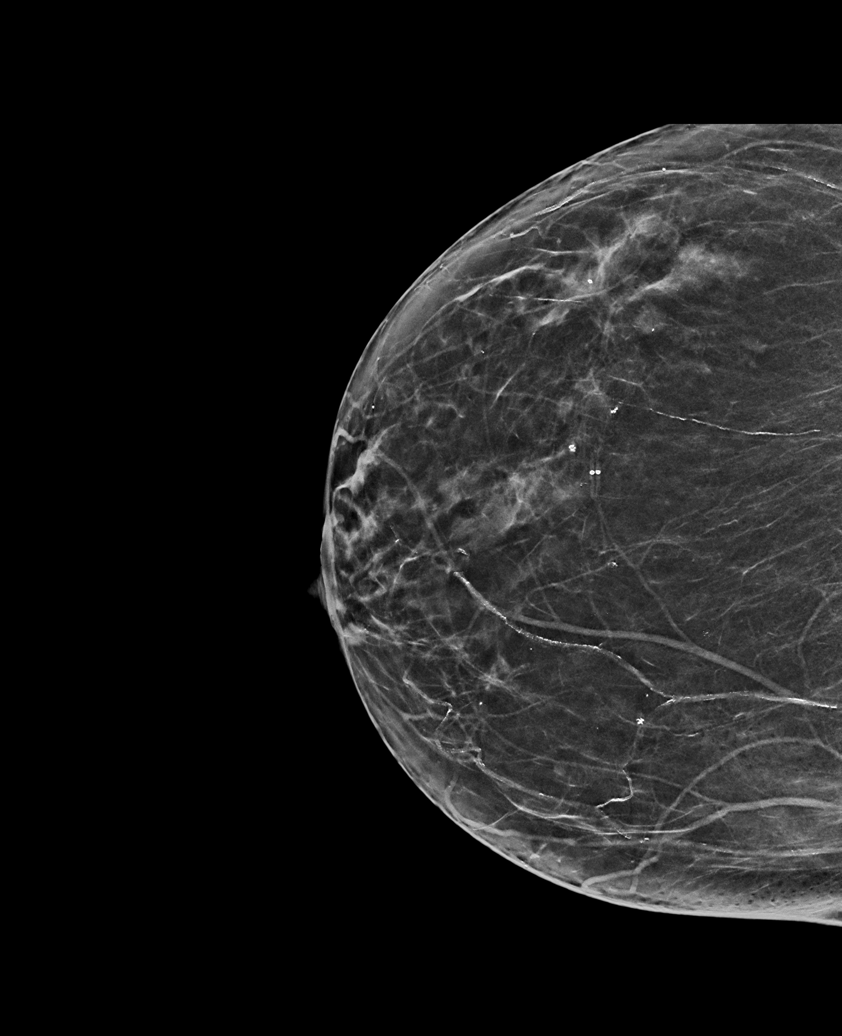

[L TAN synth-2D]
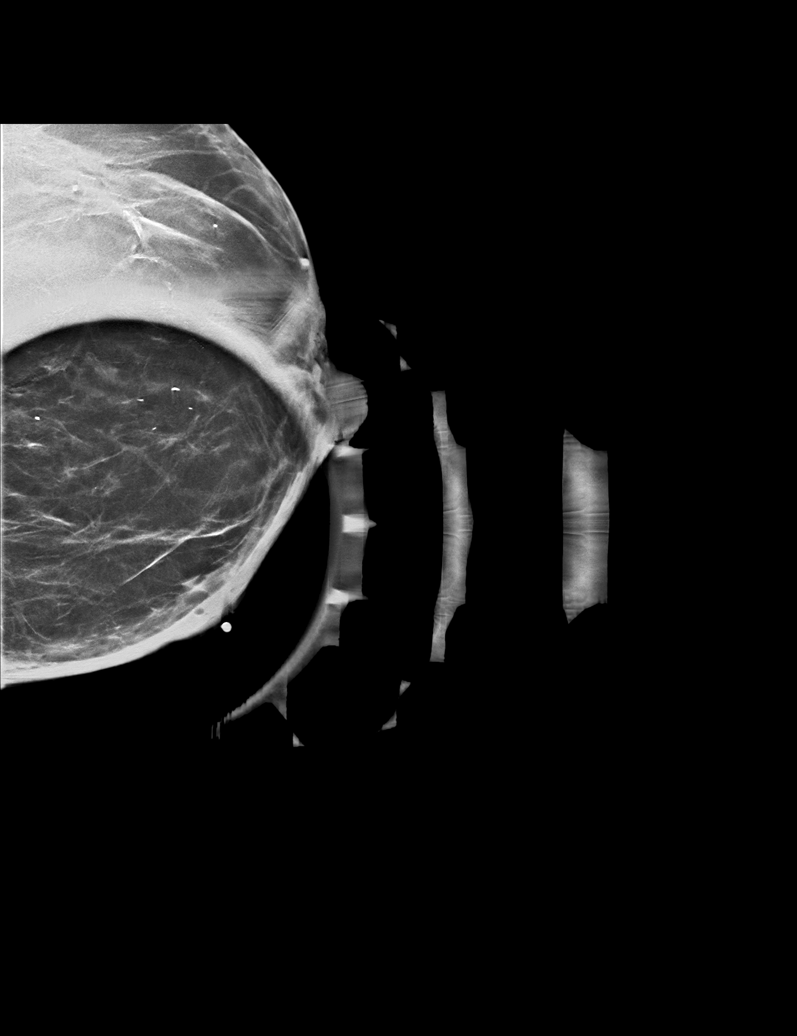

[R MLO tomo · tomo slice 40/79.0]
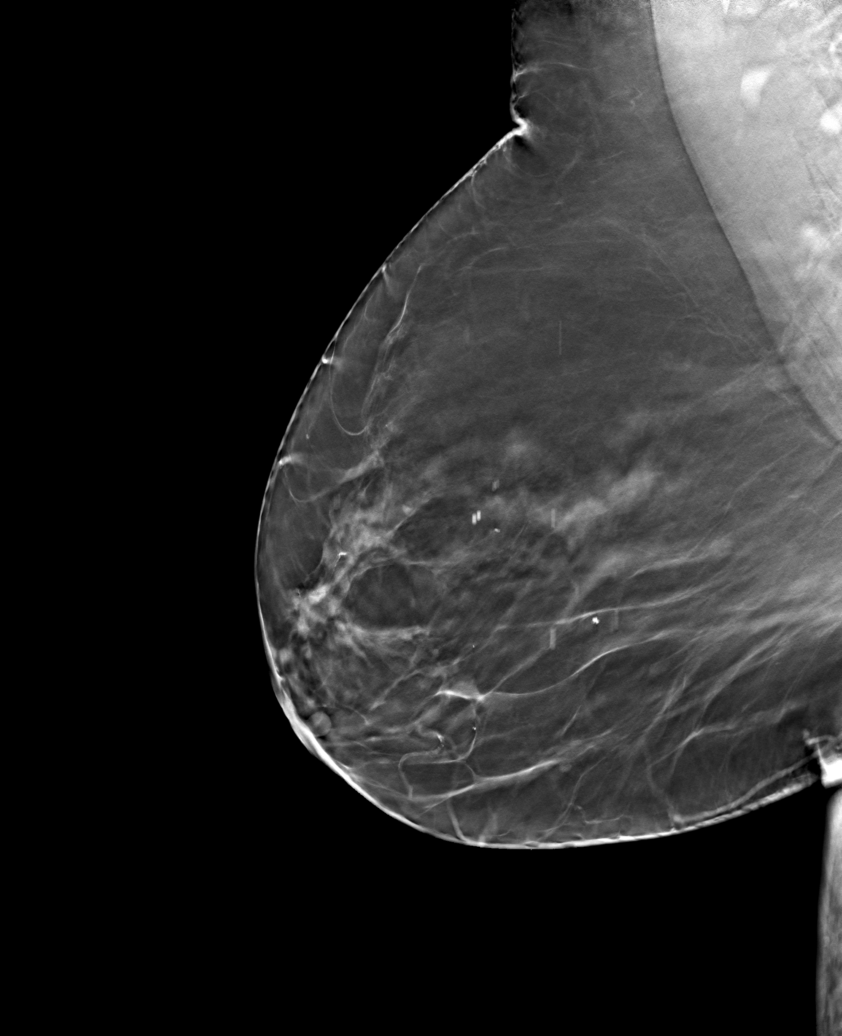

[6 of 30 positions shown; findings below may reference images not displayed]

ACR Breast Density Category b: There are scattered areas of
fibroglandular density.
FINDINGS: Spot compression tomosynthesis views were obtained of the site of
painful palpable concern in the LEFT inner breast. There is focal
skin thickening with a few locules of fat at the site of painful
concern. Otherwise no suspicious mass, distortion, or
microcalcifications are identified to suggest presence of malignancy
bilaterally.

On physical exam, there is a reddened erythematous papule with a
punctum to the skin at the site of painful concern in the LEFT
breast.

Targeted ultrasound was performed. At 7 o'clock 3 cm from the
nipple, there is a 19 by 13 x 7 mm superficial intradermal lesion
which is hypervascular and of heterogeneous echotexture. This is
most consistent with an inflamed or infected epidermal inclusion
cyst versus superficial dermal phlegmon. No focal drainable fluid
collection.
IMPRESSION: 1. There is an inflamed/infected epidermal inclusion cyst versus
dermal phlegmon at the site of palpable painful concern in the LEFT
inner breast. No focal drainable fluid collection. Recommend
antibiotic treatment and clinical follow-up to resolution.
2. No mammographic evidence of malignancy bilaterally.

RECOMMENDATION:
1. Antibiotic treatment and clinical follow-up to resolution of LEFT
breast phlegmon.
2.  Screening mammogram in one year.(Code:XC-L-IC6)

I have discussed the findings and recommendations with the patient.
If applicable, a reminder letter will be sent to the patient
regarding the next appointment.

These results will be called to the ordering clinician or
representative by the Radiologist Assistant, and communication
documented in the PACS or [REDACTED].

BI-RADS CATEGORY  2: Benign.

## 2022-06-17 ENCOUNTER — Other Ambulatory Visit: Payer: Self-pay | Admitting: Family Medicine

## 2022-06-17 ENCOUNTER — Encounter: Payer: Self-pay | Admitting: Internal Medicine

## 2022-06-17 DIAGNOSIS — M810 Age-related osteoporosis without current pathological fracture: Secondary | ICD-10-CM

## 2022-06-19 ENCOUNTER — Inpatient Hospital Stay: Payer: Medicare HMO | Attending: Obstetrics and Gynecology | Admitting: Obstetrics and Gynecology

## 2022-06-19 VITALS — BP 133/66 | HR 71 | Temp 96.6°F | Resp 20 | Wt 132.6 lb

## 2022-06-19 DIAGNOSIS — N893 Dysplasia of vagina, unspecified: Secondary | ICD-10-CM

## 2022-06-19 DIAGNOSIS — R87622 Low grade squamous intraepithelial lesion on cytologic smear of vagina (LGSIL): Secondary | ICD-10-CM

## 2022-06-19 DIAGNOSIS — N890 Mild vaginal dysplasia: Secondary | ICD-10-CM | POA: Insufficient documentation

## 2022-06-19 NOTE — Progress Notes (Signed)
Gynecologic Oncology Interval Visit   Referring Provider: Dr. 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  No new complaints.   Colposcopy 12/01/2019: Area of AWE on the posterior wall of the vagina ~ 4 cm distal from the cuff.  Biopsies were not performed.   Gynecologic Oncology History  -S/p TVH for abnormal bleeding in her 30s.  - 05/2015 pap with LGSIL and +HPV; colpo with vaginal Bx, neg - 07/2016 pap with LGSIL with +HPV; colpo with vaginal cuff Bx, neg - 07/2017 pap with LGSIL; no biopsy - 05/2018 pap with insufficient cells --->vaginal estrogen and repeat in 06/2018: LGSIL/ no HPV tested - 07/2018 colpo with bx: LGSIL (CIN I), consistent with VAIN1 - 08/19/2019 Pap ASCUS - endocervical cell noted on Pap - 10/19/2019 Colposcopy +ACE at 6 o'clock but neg [place]. 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   LGSIL Pap smear of vagina 12/01/2019   Lumbar radiculitis 10/30/2016    Past Medical History: Past Medical History:  Diagnosis Date   Anemia, unspecified    Arthritis    Diabetes mellitus without complication (HCC)    Disorder of bursae and tendons in shoulder region    unspecified   Diverticulitis    had a GI bleed with this in the past   Diverticulosis    Essential hypertension, benign    GERD (gastroesophageal reflux disease)    Heart murmur    followed by PCP   History of GI bleed    Hypertension    Mitral valve disorder    Nephrolithiasis    Osteoporosis    Pure hypercholesterolemia    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 (HCC)    unspecified type diabetes without mention of complication, not stated as uncontrolled    Past Surgical  History: Past Surgical History:  Procedure Laterality Date   ABDOMINAL HYSTERECTOMY     ANTERIOR THORACIC SPINE FUSION MULTIPLE LEVELS  10/08/2016   Procedure: ARTHRODESIS, ANTERIOR INTERBODY TECHNIQUE, INCLUDING MINIMAL DISCECTOMY TO PREPARE INTERSPACE (OTHER THAN FOR DECOMPRESSION); LUMBAR; Surgeon: Bethel Born, MD; Location: [facility] OR; Service: Neurosurgery; Laterality: N/A;   ARTHRODESIS ANTEROR CERVICAL SPINE  10/08/2016   Procedure: ARTHRODESIS, ANTERIOR INTERBODY TECHNIQUE, INCLUDING MINIMAL DISCECTOMY TO PREPARE INTERSPACE (OTHER THAN DECOMPRESSION); EACH ADDITIONAL INTERSPACE (LIST IN ADDITION TO CODE FOR PRIMARY PROCEDURE); Surgeon: Bethel Born, MD; Location: [facility] OR; Service: Neurosurgery; Laterality: N/A;   BREAST EXCISIONAL BIOPSY Left 1970   neg   CATARACT EXTRACTION W/PHACO Right 10/18/2015   Procedure: CATARACT EXTRACTION PHACO AND INTRAOCULAR LENS PLACEMENT (IOC);  Surgeon: Lockie Mola, MD;  Location: [facility];  Service: Ophthalmology;  Laterality: Right;  DIABETIC - oral meds KEEP PT TIME AFTER 8 PER PT KEEP 5TH   COLONOSCOPY WITH PROPOFOL N/A 07/18/2017   Procedure: COLONOSCOPY WITH PROPOFOL;  Surgeon: Scot Jun, MD;  Location: [facility] ENDOSCOPY;  Service: Endoscopy;  Laterality: N/A;   COLPOSCOPY  2017   vag colp-neg TVH   INSTRUMENTATION POSTERIOR SPINE 3 TO 6 VERTERAL SEGMENTS  10/08/2016   Procedure: POSTERIOR SEGMENTAL INSTRUMENTATION (EG, PEDICLE FIXATION, DUAL RODS WITH MULTIPLE HOOKS AND SUBLAMINAR WIRES); 3 TO 6 VERTEBRAL SEGMENTS (LIST IN ADDITION TO PRIMARY PROCEDURE);  Surgeon: Bethel Born, MD; Location: [facility] OR; Service: Neurosurgery; Laterality: N/A;   PARTIAL HYSTERECTOMY     in the 70's   POSTERIOR LUMBAR SPINE FUSION ONE LEVEL LATERAL TRANSVERSE TECHNIQUE  10/08/2016   Procedure: ARTHRODESIS, POSTERIOR OR POSTEROLATERAL TECHNIQUE, SINGLE LEVEL; LUMBAR (WITH LATERAL TRANSVERSE TECHNIQUE, WHEN  PERFORMED); Surgeon: Bethel Born, MD; Location: [facility] OR; Service: Neurosurgery; Laterality: N/A;   STRESS FRACTURE OF RIGHT FOOT     TOTAL VAGINAL HYSTERECTOMY      Past Gynecologic History:  Post-menopausal Contraception: none Sexually active: not sexually active  OB History:  OB History  Gravida Para Term Preterm AB Living  3 3 3         SAB IAB Ectopic Multiple Live Births               # Outcome Date GA Lbr Len/2nd Weight Sex Delivery Anes PTL Lv  3 Term           2 Term           1 Term             Family History: Family History  Problem Relation Age of Onset   Thyroid disease Mother    Pulmonary embolism Father    Diabetes Sister    Hypertension Sister    Lupus Sister    Coronary artery disease Other        uncle   Breast cancer Neg Hx     Social History: Social History   Socioeconomic History   Marital status: Divorced    Spouse name: Not on file   Number of children: 3   Years of education: college   Highest education level: Not on file  Occupational History    Comment: retired Hotel manager  Tobacco Use   Smoking status: Former    Packs/day: 2.00    Years: 20.00    Additional pack years: 0.00    Total pack years: 40.00    Types: Cigarettes    Quit date: 1985    Years since quitting: 39.3   Smokeless tobacco: Never  Vaping Use   Vaping Use: Never used  Substance and Sexual Activity   Alcohol use: Yes    Alcohol/week: 2.0 standard drinks of alcohol    Types: 2 Glasses of wine per week    Comment: socially   Drug use: Never   Sexual activity: Not Currently  Other Topics Concern   Not on file  Social History Narrative   Former smoker   Drinks 2 glasses of wine socially   3 sons   Divorced   DNR   Social Determinants of Health   Financial Resource Strain: Not on file  Food Insecurity: Not on file  Transportation Needs: Not on file  Physical Activity: Not on file  Stress: Not on file  Social Connections: Not on file   Intimate Partner Violence: Not on file    Allergies: Allergies  Allergen Reactions   Shellfish Allergy Nausea And Vomiting    Current Medications: Current Outpatient Medications  Medication Sig Dispense Refill   alendronate (FOSAMAX) 35 MG tablet Take 35 mg by mouth every 7 (seven) days. On Fridays. Take with a full glass of water on an empty stomach.     amLODipine (NORVASC) 2.5 MG tablet Take 2.5 mg by mouth 2 (two) times daily.     atorvastatin (LIPITOR) 40 MG tablet Take 40 mg by mouth daily.     calcium carbonate (OSCAL) 1500 (600 Ca) MG  TABS tablet Take by mouth 2 (two) times daily with a meal.     ferrous gluconate (FERGON) 324 MG tablet Take 324 mg by mouth 2 (two) times daily with a meal.     glipiZIDE (GLUCOTROL) 10 MG tablet Take 5 mg by mouth 2 (two) times daily before a meal. 1/2 tablet      hydrochlorothiazide (HYDRODIURIL) 25 MG tablet Take 25 mg by mouth daily.      JARDIANCE 10 MG TABS tablet Take 10 mg by mouth daily.     lisinopril (PRINIVIL,ZESTRIL) 40 MG tablet Take 40 mg by mouth daily.      metformin (FORTAMET) 1000 MG (OSM) 24 hr tablet Take 1,000 mg by mouth daily at 6 PM.      metFORMIN (GLUCOPHAGE) 500 MG tablet Take 500 mg by mouth daily with breakfast.     metoprolol succinate (TOPROL-XL) 25 MG 24 hr tablet Take 25 mg by mouth daily.     sitaGLIPtin (JANUVIA) 100 MG tablet Take 100 mg by mouth daily.      timolol (TIMOPTIC) 0.25 % ophthalmic solution Place 1 drop into the right eye 2 (two) times daily.     vitamin C (ASCORBIC ACID) 500 MG tablet Take 500 mg by mouth 2 (two) times daily.     promethazine (PHENERGAN) 25 MG tablet Take 12.5-25 mg by mouth every 8 (eight) hours as needed for nausea or vomiting. 1/2 - 1 tablet (Patient not taking: Reported on 12/01/2019)     No current facility-administered medications for this visit.   Review of Systems.  General: no complaints  HEENT: no complaints  Lungs: no complaints  Cardiac: no complaints  GI: no  complaints  GU: no complaints  Musculoskeletal: chronic back pain o/w no complaints  Extremities: no complaints  Skin: no complaints  Neuro: no complaints  Endocrine: no complaints  Psych: no complaints        Objective:  Physical Examination:  BP 133/66   Pulse 71   Temp (!) 96.6 F (35.9 C)   Resp 20   Wt 132 lb 9.6 oz (60.1 kg)   SpO2 99%   BMI 25.90 kg/m     ECOG Performance Status: 0 - Asymptomatic  GENERAL: Patient is a well appearing female in no acute distress HEENT:  PERRL, neck supple with midline trachea.  NODES:  No inguinal lymphadenopathy palpated.  ABDOMEN:  Soft, nontender, nondistended. No masses/ascites  EXTREMITIES:  No peripheral edema.   NEURO:  Nonfocal. Well oriented.  Appropriate affect.  Pelvic: EGBUS: no lesions Cervix: surgically absent Vagina: no lesions, no discharge or bleeding; Pap obtained Uterus: surgically absent BME:  no palpable masses   Lab Review Labs on site today: No labs on site today  Radiologic Imaging: No imaging on site today.     Assessment:  Jordan Small is a 82 y.o. 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  Negative exam today.   Medical co-morbidities complicating care: HTN Plan:   Problem List Items Addressed This Visit       Genitourinary   VAIN (vaginal intraepithelial neoplasia) - Primary     Other   LGSIL Pap smear of vagina    Repeat Pap and HPV testing today. Follow up results.   Guideline: For patients 25 years or older with histologic LSIL (CIN 1) who is diagnosed at consecutive visits for at least 2 years, observation is preferred (BII) but treatment is acceptable (CIII).   Suggested return to clinic  in  1 year with Dr Dalbert Garnet if Pap/HPV negative.  The patient's diagnosis, an outline of the further diagnostic and laboratory studies which will be required, the recommendation for surgery, and alternatives were discussed with her and her accompanying family  members.  All questions were answered to their satisfaction.   Leida Lauth, MD   ADDENDUM:  Pap Dr.Beasley 02/08/21 ASCUS     CC:  Christeen Douglas, MD [address] [place],  [place] [address] [phone]

## 2022-06-19 NOTE — Patient Instructions (Addendum)
Please contact Dr. Francena Hanly office [phone]) to schedule an appointment in 1 year (around April/Early May 2025). I'll call you with your pap results in approximately 2 weeks. If you have any concerning symptoms, we can see you back sooner.

## 2022-06-28 ENCOUNTER — Other Ambulatory Visit: Payer: Self-pay | Admitting: Internal Medicine

## 2022-06-28 DIAGNOSIS — Z1231 Encounter for screening mammogram for malignant neoplasm of breast: Secondary | ICD-10-CM

## 2022-07-01 ENCOUNTER — Encounter: Payer: Self-pay | Admitting: Nurse Practitioner

## 2022-07-05 ENCOUNTER — Ambulatory Visit
Admission: RE | Admit: 2022-07-05 | Discharge: 2022-07-05 | Disposition: A | Discharge status: Home / Self Care | Payer: Medicare HMO | Source: Ambulatory Visit | Attending: Family Medicine | Admitting: Family Medicine

## 2022-07-05 DIAGNOSIS — M810 Age-related osteoporosis without current pathological fracture: Secondary | ICD-10-CM | POA: Diagnosis present

## 2022-08-08 ENCOUNTER — Ambulatory Visit
Admission: RE | Admit: 2022-08-08 | Discharge: 2022-08-08 | Disposition: A | Discharge status: Home / Self Care | Payer: Medicare HMO | Source: Ambulatory Visit | Attending: Internal Medicine | Admitting: Internal Medicine

## 2022-08-08 DIAGNOSIS — Z1231 Encounter for screening mammogram for malignant neoplasm of breast: Secondary | ICD-10-CM | POA: Diagnosis present

## 2023-01-03 ENCOUNTER — Other Ambulatory Visit: Payer: Self-pay

## 2023-01-03 ENCOUNTER — Ambulatory Visit: Payer: Medicare HMO

## 2023-01-03 ENCOUNTER — Ambulatory Visit
Admission: RE | Admit: 2023-01-03 | Discharge: 2023-01-03 | Disposition: A | Discharge status: Home / Self Care | Payer: Medicare HMO | Attending: Gastroenterology | Admitting: Gastroenterology

## 2023-01-03 ENCOUNTER — Encounter
Admission: RE | Disposition: A | Discharge status: Home / Self Care | Payer: Self-pay | Source: Home / Self Care | Attending: Gastroenterology

## 2023-01-03 ENCOUNTER — Encounter: Payer: Self-pay | Admitting: *Deleted

## 2023-01-03 DIAGNOSIS — Z87891 Personal history of nicotine dependence: Secondary | ICD-10-CM | POA: Insufficient documentation

## 2023-01-03 DIAGNOSIS — K529 Noninfective gastroenteritis and colitis, unspecified: Secondary | ICD-10-CM | POA: Diagnosis present

## 2023-01-03 DIAGNOSIS — D649 Anemia, unspecified: Secondary | ICD-10-CM | POA: Diagnosis not present

## 2023-01-03 DIAGNOSIS — E785 Hyperlipidemia, unspecified: Secondary | ICD-10-CM | POA: Diagnosis not present

## 2023-01-03 DIAGNOSIS — I10 Essential (primary) hypertension: Secondary | ICD-10-CM | POA: Diagnosis not present

## 2023-01-03 DIAGNOSIS — D759 Disease of blood and blood-forming organs, unspecified: Secondary | ICD-10-CM | POA: Insufficient documentation

## 2023-01-03 DIAGNOSIS — K640 First degree hemorrhoids: Secondary | ICD-10-CM | POA: Insufficient documentation

## 2023-01-03 DIAGNOSIS — E119 Type 2 diabetes mellitus without complications: Secondary | ICD-10-CM | POA: Insufficient documentation

## 2023-01-03 DIAGNOSIS — Z7984 Long term (current) use of oral hypoglycemic drugs: Secondary | ICD-10-CM | POA: Diagnosis not present

## 2023-01-03 DIAGNOSIS — K5730 Diverticulosis of large intestine without perforation or abscess without bleeding: Secondary | ICD-10-CM | POA: Insufficient documentation

## 2023-01-03 HISTORY — PX: BIOPSY: SHX5522

## 2023-01-03 HISTORY — PX: COLONOSCOPY WITH PROPOFOL: SHX5780

## 2023-01-03 LAB — GLUCOSE, CAPILLARY: Glucose-Capillary: 100 mg/dL — ABNORMAL HIGH (ref 70–99)

## 2023-01-03 SURGERY — COLONOSCOPY WITH PROPOFOL
Anesthesia: General

## 2023-01-03 MED ORDER — SODIUM CHLORIDE 0.9 % IV SOLN: INTRAVENOUS | Status: DC

## 2023-01-03 MED ORDER — PROPOFOL 10 MG/ML IV BOLUS
INTRAVENOUS | Status: AC
  Filled 2023-01-03: qty 20

## 2023-01-03 MED ORDER — PROPOFOL 10 MG/ML IV BOLUS
INTRAVENOUS | Status: DC | PRN
  Administered 2023-01-03: 20 mg via INTRAVENOUS
  Administered 2023-01-03: 50 mg via INTRAVENOUS
  Administered 2023-01-03: 100 ug/kg/min via INTRAVENOUS

## 2023-01-03 NOTE — Anesthesia Preprocedure Evaluation (Signed)
Anesthesia Evaluation  Patient identified by MRN, date of birth, ID band Patient awake    Reviewed: Allergy & Precautions, NPO status , Patient's Chart, lab work & pertinent test results  Airway Mallampati: III  TM Distance: <3 FB Neck ROM: Full    Dental  (+) Lower Dentures, Upper Dentures   Pulmonary neg pulmonary ROS, former smoker   Pulmonary exam normal breath sounds clear to auscultation       Cardiovascular Exercise Tolerance: Good hypertension, Pt. on medications negative cardio ROS Normal cardiovascular exam Rhythm:Regular Rate:Normal     Neuro/Psych negative neurological ROS  negative psych ROS   GI/Hepatic negative GI ROS, Neg liver ROS,GERD  Medicated,,  Endo/Other  negative endocrine ROSdiabetes, Type 2, Oral Hypoglycemic Agents    Renal/GU negative Renal ROS  negative genitourinary   Musculoskeletal  (+) Arthritis ,    Abdominal Normal abdominal exam  (+)   Peds negative pediatric ROS (+)  Hematology negative hematology ROS (+) Blood dyscrasia, anemia   Anesthesia Other Findings Past Medical History: No date: Anemia, unspecified No date: Arthritis No date: Diabetes mellitus without complication (HCC) No date: Disorder of bursae and tendons in shoulder region     Comment:  unspecified No date: Diverticulitis     Comment:  had a GI bleed with this in the past No date: Diverticulosis No date: Essential hypertension, benign No date: GERD (gastroesophageal reflux disease) No date: Heart murmur     Comment:  followed by PCP No date: History of GI bleed No date: Hypertension No date: Mitral valve disorder No date: Nephrolithiasis No date: Osteoporosis No date: Pure hypercholesterolemia No date: 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 (HCC)     Comment:  unspecified type diabetes without mention of               complication, not stated as uncontrolled  Past Surgical  History: No date: ABDOMINAL HYSTERECTOMY 10/08/2016: ANTERIOR THORACIC SPINE FUSION MULTIPLE LEVELS     Comment:  Procedure: ARTHRODESIS, ANTERIOR INTERBODY TECHNIQUE,               INCLUDING MINIMAL DISCECTOMY TO PREPARE INTERSPACE (OTHER              THAN FOR DECOMPRESSION); LUMBAR; Surgeon: Bethel Born, MD; Location: [facility] OR; Service:               Neurosurgery; Laterality: N/A; 10/08/2016: ARTHRODESIS ANTEROR CERVICAL SPINE     Comment:  Procedure: ARTHRODESIS, ANTERIOR INTERBODY TECHNIQUE,               INCLUDING MINIMAL DISCECTOMY TO PREPARE INTERSPACE (OTHER              THAN DECOMPRESSION); EACH ADDITIONAL INTERSPACE (LIST IN               ADDITION TO CODE FOR PRIMARY PROCEDURE); Surgeon:               Bethel Born, MD; Location: [facility] OR;               Service: Neurosurgery; Laterality: N/A; 1970: BREAST EXCISIONAL BIOPSY; Left     Comment:  neg 10/18/2015: CATARACT EXTRACTION W/PHACO; Right     Comment:  Procedure: CATARACT EXTRACTION PHACO AND INTRAOCULAR               LENS PLACEMENT (IOC);  Surgeon: Lockie Mola, MD;  Location: [facility];  Service: Ophthalmology;                Laterality: Right;  DIABETIC - oral meds KEEP PT TIME               AFTER 8 PER PT KEEP 5TH 07/18/2017: COLONOSCOPY WITH PROPOFOL; N/A     Comment:  Procedure: COLONOSCOPY WITH PROPOFOL;  Surgeon: Scot Jun, MD;  Location: [facility] END[facility]COPY;  Service:               Endoscopy;  Laterality: N/A; 2017: COLPOSCOPY     Comment:  vag colp-neg TVH 10/08/2016: INSTRUMENTATION POSTERIOR SPINE 3 TO 6 VERTERAL SEGMENTS     Comment:  Procedure: POSTERIOR SEGMENTAL INSTRUMENTATION (EG,               PEDICLE FIXATION, DUAL RODS WITH MULTIPLE HOOKS AND               SUBLAMINAR WIRES); 3 TO 6 VERTEBRAL SEGMENTS (LIST IN               ADDITION TO PRIMARY PROCEDURE); Surgeon: Bethel Born, MD;  Location: [facility] OR; Service:               Neurosurgery; Laterality: N/A; No date: PARTIAL HYSTERECTOMY     Comment:  in the 70's 10/08/2016: POSTERIOR LUMBAR SPINE FUSION ONE LEVEL LATERAL  TRANSVERSE TECHNIQUE     Comment:  Procedure: ARTHRODESIS, POSTERIOR OR POSTEROLATERAL               TECHNIQUE, SINGLE LEVEL; LUMBAR (WITH LATERAL TRANSVERSE               TECHNIQUE, WHEN PERFORMED); Surgeon: Bethel Born, MD; Location: [facility] OR; Service: Neurosurgery;               Laterality: N/A; No date: STRESS FRACTURE OF RIGHT FOOT No date: TOTAL VAGINAL HYSTERECTOMY  BMI    Body Mass Index: 23.05 kg/m      Reproductive/Obstetrics negative OB ROS                             Anesthesia Physical Anesthesia Plan  ASA: 3  Anesthesia Plan: General   Post-op Pain Management:    Induction: Intravenous  PONV Risk Score and Plan: Propofol infusion and TIVA  Airway Management Planned: Natural Airway and Nasal Cannula  Additional Equipment:   Intra-op Plan:   Post-operative Plan:   Informed Consent: I have reviewed the patients History and Physical, chart, labs and discussed the procedure including the risks, benefits and alternatives for the proposed anesthesia with the patient or authorized representative who has indicated his/her understanding and acceptance.     Dental Advisory Given  Plan Discussed with: CRNA and Surgeon  Anesthesia Plan Comments:        Anesthesia Quick Evaluation

## 2023-01-03 NOTE — Anesthesia Procedure Notes (Signed)
Procedure Name: MAC Date/Time: 01/03/2023 12:49 PM  Performed by: Elisabeth Pigeon, CRNAPre-anesthesia Checklist: Patient identified, Emergency Drugs available, Suction available, Timeout performed and Patient being monitored Patient Re-evaluated:Patient Re-evaluated prior to induction Oxygen Delivery Method: Simple face mask

## 2023-01-03 NOTE — Interval H&P Note (Signed)
History and Physical Interval Note:  01/03/2023 12:34 PM  Jordan Small  has presented today for surgery, with the diagnosis of Diarrhea.  The various methods of treatment have been discussed with the patient and family. After consideration of risks, benefits and other options for treatment, the patient has consented to  Procedure(s): COLONOSCOPY WITH PROPOFOL (N/A) as a surgical intervention.  The patient's history has been reviewed, patient examined, no change in status, stable for surgery.  I have reviewed the patient's chart and labs.  Questions were answered to the patient's satisfaction.     Regis Bill  Ok to proceed with colonoscopy

## 2023-01-03 NOTE — Transfer of Care (Signed)
Immediate Anesthesia Transfer of Care Note  Patient: Jordan Small  Procedure(s) Performed: COLONOSCOPY WITH PROPOFOL  Patient Location: PACU  Anesthesia Type:MAC  Level of Consciousness: awake  Airway & Oxygen Therapy: Patient Spontanous Breathing  Post-op Assessment: Report given to RN and Post -op Vital signs reviewed and stable  Post vital signs: Reviewed and stable  Last Vitals:  Vitals Value Taken Time  BP    Temp    Pulse    Resp    SpO2      Last Pain:  Vitals:   01/03/23 1128  TempSrc: Temporal  PainSc: 0-No pain         Complications: No notable events documented.

## 2023-01-03 NOTE — H&P (Signed)
Outpatient short stay form Pre-procedure 01/03/2023  Regis Bill, MD  Primary Physician: Louis Matte, MD  Reason for visit:  Chronic diarrhea  History of present illness:    82 y/o lady with history of hypertension, HLD, and DM II here for colonoscopy for chronic diarrhea. Last colonoscopy in 2019 with small TA some focal colitis but no microscopic colitis. No blood thinners. No family history of GI malignancies. No significant abdominal surgeries.    Current Facility-Administered Medications:    0.9 %  sodium chloride infusion, , Intravenous, Continuous, Emiyah Spraggins, Rossie Muskrat, MD, Last Rate: 20 mL/hr at 01/03/23 1145, New Bag at 01/03/23 1145  Medications Prior to Admission  Medication Sig Dispense Refill Last Dose   alendronate (FOSAMAX) 35 MG tablet Take 35 mg by mouth every 7 (seven) days. On Fridays. Take with a full glass of water on an empty stomach.   Past Week   amLODipine (NORVASC) 2.5 MG tablet Take 2.5 mg by mouth 2 (two) times daily.   01/03/2023 at 0600   atorvastatin (LIPITOR) 40 MG tablet Take 40 mg by mouth daily.   Past Week   calcium carbonate (OSCAL) 1500 (600 Ca) MG TABS tablet Take by mouth 2 (two) times daily with a meal.   Past Week   ferrous gluconate (FERGON) 324 MG tablet Take 324 mg by mouth 2 (two) times daily with a meal.   Past Week   glipiZIDE (GLUCOTROL) 10 MG tablet Take 5 mg by mouth 2 (two) times daily before a meal. 1/2 tablet    Past Week   hydrochlorothiazide (HYDRODIURIL) 25 MG tablet Take 25 mg by mouth daily.    Past Week   JARDIANCE 10 MG TABS tablet Take 10 mg by mouth daily.   Past Week   lisinopril (PRINIVIL,ZESTRIL) 40 MG tablet Take 40 mg by mouth daily.    Past Week   metformin (FORTAMET) 1000 MG (OSM) 24 hr tablet Take 1,000 mg by mouth daily at 6 PM.    Past Week   metFORMIN (GLUCOPHAGE) 500 MG tablet Take 500 mg by mouth daily with breakfast.   Past Week   metoprolol succinate (TOPROL-XL) 25 MG 24 hr tablet Take 25 mg  by mouth daily.   01/03/2023 at 0600   promethazine (PHENERGAN) 25 MG tablet Take 12.5-25 mg by mouth every 8 (eight) hours as needed for nausea or vomiting. 1/2 - 1 tablet   Past Week   sitaGLIPtin (JANUVIA) 100 MG tablet Take 100 mg by mouth daily.    Past Week   timolol (TIMOPTIC) 0.25 % ophthalmic solution Place 1 drop into the right eye 2 (two) times daily.   Past Week   vitamin C (ASCORBIC ACID) 500 MG tablet Take 500 mg by mouth 2 (two) times daily.   Past Week     Allergies  Allergen Reactions   Shellfish Allergy Nausea And Vomiting     Past Medical History:  Diagnosis Date   Anemia, unspecified    Arthritis    Diabetes mellitus without complication (HCC)    Disorder of bursae and tendons in shoulder region    unspecified   Diverticulitis    had a GI bleed with this in the past   Diverticulosis    Essential hypertension, benign    GERD (gastroesophageal reflux disease)    Heart murmur    followed by PCP   History of GI bleed    Hypertension    Mitral valve disorder    Nephrolithiasis  Osteoporosis    Pure hypercholesterolemia    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 (HCC)    unspecified type diabetes without mention of complication, not stated as uncontrolled    Review of systems:  Otherwise negative.    Physical Exam  Gen: Alert, oriented. Appears stated age.  HEENT: PERRLA. Lungs: No respiratory distress CV: RRR Abd: soft, benign, no masses Ext: No edema    Planned procedures: Proceed with colonoscopy. The patient understands the nature of the planned procedure, indications, risks, alternatives and potential complications including but not limited to bleeding, infection, perforation, damage to internal organs and possible oversedation/side effects from anesthesia. The patient agrees and gives consent to proceed.  Please refer to procedure notes for findings, recommendations and patient disposition/instructions.     Regis Bill, MD [facility] Gastroenterology

## 2023-01-03 NOTE — Op Note (Signed)
[facility] Gastroenterology Patient Name: Jordan Small Procedure Date: 01/03/2023 12:47 PM MRN: [id number] Account #: [id number] Date of Birth: 05/22/1940 Admit Type: Outpatient Age: 82 Room: [facility] ENDO ROOM 3 Gender: Female Note Status: Finalized Instrument Name: Peds Colonoscope [id number] Procedure:             Colonoscopy Indications:           Chronic diarrhea Providers:             Eather Colas MD, MD Medicines:             Monitored Anesthesia Care Complications:         No immediate complications. Estimated blood loss:                         Minimal. Procedure:             Pre-Anesthesia Assessment:                        - Prior to the procedure, a History and Physical was                         performed, and patient medications and allergies were                         reviewed. The patient is competent. The risks and                         benefits of the procedure and the sedation options and                         risks were discussed with the patient. All questions                         were answered and informed consent was obtained.                         Patient identification and proposed procedure were                         verified by the physician, the nurse, the                         anesthesiologist, the anesthetist and the technician                         in the endoscopy suite. Mental Status Examination:                         alert and oriented. Airway Examination: normal                         oropharyngeal airway and neck mobility. Respiratory                         Examination: clear to auscultation. CV Examination:                         normal. Prophylactic Antibiotics: The patient does not  require prophylactic antibiotics. Prior                         Anticoagulants: The patient has taken no anticoagulant                         or antiplatelet agents. ASA Grade Assessment: III - A                          patient with severe systemic disease. After reviewing                         the risks and benefits, the patient was deemed in                         satisfactory condition to undergo the procedure. The                         anesthesia plan was to use monitored anesthesia care                         (MAC). Immediately prior to administration of                         medications, the patient was re-assessed for adequacy                         to receive sedatives. The heart rate, respiratory                         rate, oxygen saturations, blood pressure, adequacy of                         pulmonary ventilation, and response to care were                         monitored throughout the procedure. The physical                         status of the patient was re-assessed after the                         procedure.                        After obtaining informed consent, the colonoscope was                         passed under direct vision. Throughout the procedure,                         the patient's blood pressure, pulse, and oxygen                         saturations were monitored continuously. The                         Colonoscope was introduced through the anus and  advanced to the the terminal ileum. The colonoscopy                         was performed without difficulty. The patient                         tolerated the procedure well. The quality of the bowel                         preparation was good. The terminal ileum, ileocecal                         valve, appendiceal orifice, and rectum were                         photographed. Findings:      The perianal and digital rectal examinations were normal.      The terminal ileum appeared normal.      Normal mucosa was found in the entire colon. Biopsies for histology were       taken with a cold forceps from the entire colon for evaluation of       microscopic colitis.  Estimated blood loss was minimal.      Many large-mouthed and small-mouthed diverticula were found in the       sigmoid colon.      Internal hemorrhoids were found during retroflexion. The hemorrhoids       were Grade I (internal hemorrhoids that do not prolapse).      The exam was otherwise without abnormality on direct and retroflexion       views. Impression:            - The examined portion of the ileum was normal.                        - Normal mucosa in the entire examined colon. Biopsied.                        - Diverticulosis in the sigmoid colon.                        - Internal hemorrhoids.                        - The examination was otherwise normal on direct and                         retroflexion views. Recommendation:        - Discharge patient to home.                        - Resume previous diet.                        - Continue present medications.                        - Await pathology results.                        - Repeat colonoscopy is not recommended due to current  age (43 years or older) for surveillance.                        - Return to referring physician as previously                         scheduled. Procedure Code(s):     --- Professional ---                        [id number], Colonoscopy, flexible; with biopsy, single or                         multiple Diagnosis Code(s):     --- Professional ---                        K64.0, First degree hemorrhoids                        K52.9, Noninfective gastroenteritis and colitis,                         unspecified                        K57.30, Diverticulosis of large intestine without                         perforation or abscess without bleeding CPT copyright 2022 American [employer] Association. All rights reserved. The codes documented in this report are preliminary and upon coder review may  be revised to meet current compliance requirements. Eather Colas MD,  MD 01/03/2023 1:12:49 PM Number of Addenda: 0 Note Initiated On: 01/03/2023 12:47 PM Scope Withdrawal Time: 0 hours 8 minutes 10 seconds  Total Procedure Duration: 0 hours 15 minutes 21 seconds  Estimated Blood Loss:  Estimated blood loss was minimal.      [facility]

## 2023-01-06 ENCOUNTER — Encounter: Payer: Self-pay | Admitting: Gastroenterology

## 2023-01-06 LAB — SURGICAL PATHOLOGY

## 2023-01-10 NOTE — Anesthesia Postprocedure Evaluation (Signed)
Anesthesia Post Note  Patient: Jordan Small  Procedure(s) Performed: COLONOSCOPY WITH PROPOFOL BIOPSY  Patient location during evaluation: PACU Anesthesia Type: General Level of consciousness: awake and alert Pain management: satisfactory to patient Vital Signs Assessment: post-procedure vital signs reviewed and stable Respiratory status: spontaneous breathing Anesthetic complications: no   No notable events documented.   Last Vitals:  Vitals:   01/03/23 1321 01/03/23 1331  BP:  (!) 145/62  Pulse: (!) 58 (!) 54  Resp:  16  Temp:    SpO2: 100% 100%    Last Pain:  Vitals:   01/04/23 1229  TempSrc:   PainSc: 0-No pain                 VAN STAVEREN,Verania Salberg

## 2023-03-03 DIAGNOSIS — Z124 Encounter for screening for malignant neoplasm of cervix: Secondary | ICD-10-CM | POA: Diagnosis not present

## 2023-03-03 DIAGNOSIS — Z1331 Encounter for screening for depression: Secondary | ICD-10-CM | POA: Diagnosis not present

## 2023-03-03 DIAGNOSIS — Z1272 Encounter for screening for malignant neoplasm of vagina: Secondary | ICD-10-CM | POA: Diagnosis not present

## 2023-03-03 DIAGNOSIS — N893 Dysplasia of vagina, unspecified: Secondary | ICD-10-CM | POA: Diagnosis not present

## 2023-03-03 DIAGNOSIS — R87622 Low grade squamous intraepithelial lesion on cytologic smear of vagina (LGSIL): Secondary | ICD-10-CM | POA: Diagnosis not present

## 2023-03-14 DIAGNOSIS — E1169 Type 2 diabetes mellitus with other specified complication: Secondary | ICD-10-CM | POA: Diagnosis not present

## 2023-03-14 DIAGNOSIS — E875 Hyperkalemia: Secondary | ICD-10-CM | POA: Diagnosis not present

## 2023-03-14 DIAGNOSIS — N1831 Chronic kidney disease, stage 3a: Secondary | ICD-10-CM | POA: Diagnosis not present

## 2023-03-14 DIAGNOSIS — D509 Iron deficiency anemia, unspecified: Secondary | ICD-10-CM | POA: Diagnosis not present

## 2023-03-14 DIAGNOSIS — E1122 Type 2 diabetes mellitus with diabetic chronic kidney disease: Secondary | ICD-10-CM | POA: Diagnosis not present

## 2023-03-14 DIAGNOSIS — R195 Other fecal abnormalities: Secondary | ICD-10-CM | POA: Diagnosis not present

## 2023-03-14 DIAGNOSIS — E1140 Type 2 diabetes mellitus with diabetic neuropathy, unspecified: Secondary | ICD-10-CM | POA: Diagnosis not present

## 2023-03-14 DIAGNOSIS — M810 Age-related osteoporosis without current pathological fracture: Secondary | ICD-10-CM | POA: Diagnosis not present

## 2023-03-14 DIAGNOSIS — E782 Mixed hyperlipidemia: Secondary | ICD-10-CM | POA: Diagnosis not present

## 2023-03-14 DIAGNOSIS — I10 Essential (primary) hypertension: Secondary | ICD-10-CM | POA: Diagnosis not present

## 2023-03-14 DIAGNOSIS — Z7984 Long term (current) use of oral hypoglycemic drugs: Secondary | ICD-10-CM | POA: Diagnosis not present

## 2023-03-19 DIAGNOSIS — Z79899 Other long term (current) drug therapy: Secondary | ICD-10-CM | POA: Diagnosis not present

## 2023-03-19 DIAGNOSIS — M810 Age-related osteoporosis without current pathological fracture: Secondary | ICD-10-CM | POA: Diagnosis not present

## 2023-03-19 DIAGNOSIS — M1990 Unspecified osteoarthritis, unspecified site: Secondary | ICD-10-CM | POA: Diagnosis not present

## 2023-03-19 DIAGNOSIS — E7800 Pure hypercholesterolemia, unspecified: Secondary | ICD-10-CM | POA: Diagnosis not present

## 2023-03-19 DIAGNOSIS — Z87891 Personal history of nicotine dependence: Secondary | ICD-10-CM | POA: Diagnosis not present

## 2023-03-19 DIAGNOSIS — I10 Essential (primary) hypertension: Secondary | ICD-10-CM | POA: Diagnosis not present

## 2023-03-19 DIAGNOSIS — Z7984 Long term (current) use of oral hypoglycemic drugs: Secondary | ICD-10-CM | POA: Diagnosis not present

## 2023-03-19 DIAGNOSIS — E119 Type 2 diabetes mellitus without complications: Secondary | ICD-10-CM | POA: Diagnosis not present

## 2023-03-19 DIAGNOSIS — K5730 Diverticulosis of large intestine without perforation or abscess without bleeding: Secondary | ICD-10-CM | POA: Diagnosis not present

## 2023-03-19 DIAGNOSIS — R159 Full incontinence of feces: Secondary | ICD-10-CM | POA: Diagnosis not present

## 2023-03-19 DIAGNOSIS — R197 Diarrhea, unspecified: Secondary | ICD-10-CM | POA: Diagnosis not present

## 2023-03-26 DIAGNOSIS — R151 Fecal smearing: Secondary | ICD-10-CM | POA: Diagnosis not present

## 2023-03-26 DIAGNOSIS — R7989 Other specified abnormal findings of blood chemistry: Secondary | ICD-10-CM | POA: Diagnosis not present

## 2023-03-26 DIAGNOSIS — R197 Diarrhea, unspecified: Secondary | ICD-10-CM | POA: Diagnosis not present

## 2023-03-26 DIAGNOSIS — H401112 Primary open-angle glaucoma, right eye, moderate stage: Secondary | ICD-10-CM | POA: Diagnosis not present

## 2023-03-26 DIAGNOSIS — M6289 Other specified disorders of muscle: Secondary | ICD-10-CM | POA: Diagnosis not present

## 2023-04-08 DIAGNOSIS — I10 Essential (primary) hypertension: Secondary | ICD-10-CM | POA: Diagnosis not present

## 2023-04-08 DIAGNOSIS — N8184 Pelvic muscle wasting: Secondary | ICD-10-CM | POA: Diagnosis not present

## 2023-04-08 DIAGNOSIS — R634 Abnormal weight loss: Secondary | ICD-10-CM | POA: Diagnosis not present

## 2023-04-08 DIAGNOSIS — E1122 Type 2 diabetes mellitus with diabetic chronic kidney disease: Secondary | ICD-10-CM | POA: Diagnosis not present

## 2023-04-08 DIAGNOSIS — R7989 Other specified abnormal findings of blood chemistry: Secondary | ICD-10-CM | POA: Diagnosis not present

## 2023-04-08 DIAGNOSIS — R197 Diarrhea, unspecified: Secondary | ICD-10-CM | POA: Diagnosis not present

## 2023-04-08 DIAGNOSIS — K219 Gastro-esophageal reflux disease without esophagitis: Secondary | ICD-10-CM | POA: Diagnosis not present

## 2023-04-08 DIAGNOSIS — E559 Vitamin D deficiency, unspecified: Secondary | ICD-10-CM | POA: Diagnosis not present

## 2023-04-10 ENCOUNTER — Other Ambulatory Visit: Payer: Self-pay | Admitting: Internal Medicine

## 2023-04-10 DIAGNOSIS — R634 Abnormal weight loss: Secondary | ICD-10-CM

## 2023-04-17 ENCOUNTER — Ambulatory Visit
Admission: RE | Admit: 2023-04-17 | Discharge: 2023-04-17 | Disposition: A | Discharge status: Home / Self Care | Payer: No Typology Code available for payment source | Source: Ambulatory Visit | Attending: Internal Medicine | Admitting: Internal Medicine

## 2023-04-17 DIAGNOSIS — S2232XA Fracture of one rib, left side, initial encounter for closed fracture: Secondary | ICD-10-CM | POA: Insufficient documentation

## 2023-04-17 DIAGNOSIS — K5730 Diverticulosis of large intestine without perforation or abscess without bleeding: Secondary | ICD-10-CM | POA: Diagnosis not present

## 2023-04-17 DIAGNOSIS — R634 Abnormal weight loss: Secondary | ICD-10-CM | POA: Insufficient documentation

## 2023-04-17 DIAGNOSIS — X58XXXA Exposure to other specified factors, initial encounter: Secondary | ICD-10-CM | POA: Insufficient documentation

## 2023-04-17 DIAGNOSIS — E049 Nontoxic goiter, unspecified: Secondary | ICD-10-CM | POA: Diagnosis not present

## 2023-04-17 DIAGNOSIS — R590 Localized enlarged lymph nodes: Secondary | ICD-10-CM | POA: Diagnosis not present

## 2023-04-17 DIAGNOSIS — K5900 Constipation, unspecified: Secondary | ICD-10-CM | POA: Insufficient documentation

## 2023-04-17 DIAGNOSIS — R918 Other nonspecific abnormal finding of lung field: Secondary | ICD-10-CM | POA: Insufficient documentation

## 2023-04-17 DIAGNOSIS — I700 Atherosclerosis of aorta: Secondary | ICD-10-CM | POA: Insufficient documentation

## 2023-04-17 DIAGNOSIS — E042 Nontoxic multinodular goiter: Secondary | ICD-10-CM | POA: Diagnosis not present

## 2023-04-17 LAB — POCT I-STAT CREATININE: Creatinine, Ser: 1.5 mg/dL — ABNORMAL HIGH (ref 0.44–1.00)

## 2023-04-17 MED ORDER — IOHEXOL 300 MG/ML  SOLN
80.0000 mL | Freq: Once | INTRAMUSCULAR | Status: AC | PRN
  Administered 2023-04-17: 80 mL via INTRAVENOUS

## 2023-05-08 DIAGNOSIS — E119 Type 2 diabetes mellitus without complications: Secondary | ICD-10-CM | POA: Diagnosis not present

## 2023-05-08 DIAGNOSIS — H2512 Age-related nuclear cataract, left eye: Secondary | ICD-10-CM | POA: Diagnosis not present

## 2023-05-08 DIAGNOSIS — H40002 Preglaucoma, unspecified, left eye: Secondary | ICD-10-CM | POA: Diagnosis not present

## 2023-05-08 DIAGNOSIS — H401112 Primary open-angle glaucoma, right eye, moderate stage: Secondary | ICD-10-CM | POA: Diagnosis not present

## 2023-06-18 ENCOUNTER — Ambulatory Visit: Payer: Medicare HMO | Attending: Internal Medicine | Admitting: Physical Therapy

## 2023-06-18 ENCOUNTER — Encounter: Payer: Self-pay | Admitting: Physical Therapy

## 2023-06-18 DIAGNOSIS — M2170 Unequal limb length (acquired), unspecified site: Secondary | ICD-10-CM | POA: Diagnosis present

## 2023-06-18 DIAGNOSIS — M4125 Other idiopathic scoliosis, thoracolumbar region: Secondary | ICD-10-CM | POA: Diagnosis present

## 2023-06-18 DIAGNOSIS — R2689 Other abnormalities of gait and mobility: Secondary | ICD-10-CM | POA: Diagnosis present

## 2023-06-18 NOTE — Therapy (Signed)
 OUTPATIENT PHYSICAL THERAPY EVALUATION   Patient Name: Jordan Small MRN: [id number] DOB:[birth year], 83 y.o., female Today's Date: 06/18/2023   PT End of Session - 06/18/23 1558     Visit Number 1    Number of Visits 10    Date for PT Re-Evaluation 08/27/23    PT Start Time 1545    PT Stop Time 1630    PT Time Calculation (min) 45 min    Activity Tolerance Patient tolerated treatment well;No increased pain    Behavior During Therapy WFL for tasks assessed/performed             Past Medical History:  Diagnosis Date   Anemia, unspecified    Arthritis    Diabetes mellitus without complication (HCC)    Disorder of bursae and tendons in shoulder region    unspecified   Diverticulitis    had a GI bleed with this in the past   Diverticulosis    Essential hypertension, benign    GERD (gastroesophageal reflux disease)    Heart murmur    followed by PCP   History of GI bleed    Hypertension    Mitral valve disorder    Nephrolithiasis    Osteoporosis    Pure hypercholesterolemia    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 (HCC)    unspecified type diabetes without mention of complication, not stated as uncontrolled   Past Surgical History:  Procedure Laterality Date   ABDOMINAL HYSTERECTOMY     ANTERIOR THORACIC SPINE FUSION MULTIPLE LEVELS  10/08/2016   Procedure: ARTHRODESIS, ANTERIOR INTERBODY TECHNIQUE, INCLUDING MINIMAL DISCECTOMY TO PREPARE INTERSPACE (OTHER THAN FOR DECOMPRESSION); LUMBAR; Surgeon: Rosella Conn, MD; Location: [facility] OR; Service: Neurosurgery; Laterality: N/A;   ARTHRODESIS ANTEROR CERVICAL SPINE  10/08/2016   Procedure: ARTHRODESIS, ANTERIOR INTERBODY TECHNIQUE, INCLUDING MINIMAL DISCECTOMY TO PREPARE INTERSPACE (OTHER THAN DECOMPRESSION); EACH ADDITIONAL INTERSPACE (LIST IN ADDITION TO CODE FOR PRIMARY PROCEDURE); Surgeon: Rosella Conn, MD; Location: [facility] OR; Service: Neurosurgery; Laterality: N/A;   BIOPSY   01/03/2023   Procedure: BIOPSY;  Surgeon: Shane Darling, MD;  Location: [facility] ENDOSCOPY;  Service: Endoscopy;;   BREAST EXCISIONAL BIOPSY Left 1970   neg   CATARACT EXTRACTION W/PHACO Right 10/18/2015   Procedure: CATARACT EXTRACTION PHACO AND INTRAOCULAR LENS PLACEMENT (IOC);  Surgeon: Annell Kidney, MD;  Location[facility];  Service: Ophthalmology;  Laterality: Right;  DIABETIC - oral meds KEEP PT TIME AFTER 8 PER PT KEEP 5TH   COLONOSCOPY WITH PROPOFOL  N/A 07/18/2017   Procedure: COLONOSCOPY WITH PROPOFOL ;  Surgeon: Cassie Click, MD;  Location: [facility] ENDOSCOPY;  Service: Endoscopy;  Laterality: N/A;   COLONOSCOPY WITH PROPOFOL  N/A 01/03/2023   Procedure: COLONOSCOPY WITH PROPOFOL ;  Surgeon: Shane Darling, MD;  Location: [facility] ENDOSCOPY;  Service: Endoscopy;  Laterality: N/A;   COLPOSCOPY  2017   vag colp-neg TV[facility]   INSTRUMENTATION POSTERIOR SPINE 3 TO 6 VERTERAL SEGMENTS  10/08/2016   Procedure: POSTERIOR SEGMENTAL INSTRUMENTATION (EG, PEDICLE FIXATION, DUAL RODS WITH MULTIPLE HOOKS AND SUBLAMINAR WIRES); 3 TO 6 VERTEBRAL SEGMENTS (LIST IN ADDITION TO PRIMARY PROCEDURE); Surgeon: Rosella Conn, MD; Location: [facility] OR; Service: Neurosurgery; Laterality: N/A;   PARTIAL HYSTERECTOMY     in the 70's   POSTERIOR LUMBAR SPINE FUSION ONE LEVEL LATERAL TRANSVERSE TECHNIQUE  10/08/2016   Procedure: ARTHRODESIS, POSTERIOR OR POSTEROLATERAL TECHNIQUE, SINGLE LEVEL; LUMBAR (WITH LATERAL TRANSVERSE TECHNIQUE, WHEN PERFORMED); Surgeon: Rosella Conn, MD; Location: [facility] OR; Service: Neurosurgery; Laterality: N/A;   STRESS FRACTURE  OF RIGHT FOOT     TOTAL VAGINAL HYSTERECTOMY     Patient Active Problem List   Diagnosis Date Noted   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   LGSIL Pap smear of vagina 12/01/2019   Lumbar radiculitis 10/30/2016    PCP: Entzminger MD   REFERRING PROVIDER: Entzminger MD   REFERRING DIAG: N81.84 (ICD-10-CM)  - Pelvic muscle wasting   Rationale for Evaluation and Treatment Rehabilitation  THERAPY DIAG:  Leg length discrepancy  Other idiopathic scoliosis, thoracolumbar region  Other abnormalities of gait and mobility  ONSET DATE:   SUBJECTIVE:                                                                                                                                                                                           SUBJECTIVE STATEMENT: 1) Runny stools: Pt has had runny stools for the past 2 years. Pt started a medication Fibercon which has helped "a lot" for pt to make firmer stool.  Prior to this medication, pt had to change changes 5 x a day for stool leakage  was embarrassing."  Stool consistency Type 7 in the past and now with medication Stool Type 4 - 5 . Pt now changes pads 1 x day when she has leakage before making it to the bathroom for urine.  Drinks 3- 16 fl bottle of water, no sdoa, herbal teas / lemon/ honey, no coffee , no juice   2) Urinary leakage:  2 x a week with leakage before making it to bathroom if she sits too long or wait too long   3) Low back pain B: It used to radiate down leg but it does not anymore. Pain occurs with bending, squatting , reaching up higher into cabinet, 10/10 .  Pt used to walk 1 miles but she stopped due to LBP    PERTINENT HISTORY:  Abdominal hysterectomy Date Unknown Partial hysterectomy   Lumbar surgery    PAIN:  Are you having pain? Yes: see above   PRECAUTIONS: No   WEIGHT BEARING RESTRICTIONS: No  FALLS:  Has patient fallen in last 6 months? No   LIVING ENVIRONMENT: Lives with: alone  Lives in: one story home  Stairs: 3 STE with rail   Has following equipment at home: [employer] but does not use,   OCCUPATION: substitute teacher   PLOF: IND   PATIENT GOALS:  Help improve the muscles    OBJECTIVE:   [facility] PT Assessment - 06/18/23 1642       Observation/Other Assessments   Observations ankles crossed, slumped  sitting      AROM   Overall AROM  Comments L sidebend with R LBP, B rotation with LBP, very limited L thoracic rotation due to scoliosis  ( post Tx: with shoe lift in R shoe:  no pain with rotation and increased L rotation, still LBP on R with L sidebend )      Strength   Overall Strength Comments BLE 4+/5      Palpation   Spinal mobility R shoulder and R iliac crest lowered, scoliosis L thoracic convex, thoracic kyphosis              [facility] Adult PT Treatment/Exercise - 06/18/23 1642       Ambulation/Gait   Gait Comments 0.95 m/s , R trunk lean, decreased stance on RLE      Self-Care   Self-Care Other Self-Care Comments    Other Self-Care Comments  provided shoe lift in R shoe toe box and heel, explained to replace every 6 months,      Therapeutic Activites    Therapeutic Activities Other Therapeutic Activities      Neuro Re-ed    Neuro Re-ed Details  cued for sit to stand               HOME EXERCISE PROGRAM: See pt instruction section    ASSESSMENT:  CLINICAL IMPRESSION:   Pt is a  83  yo  who presents with  following issues which impact QOL, ADL,  fitness, social and community activities:    Runny stools Low back pain B Urinary leakage  Pt's musculoskeletal assessment revealed uneven pelvic girdle and shoulder height, asymmetries to gait pattern, limited spinal /pelvic mobility, dyscoordination and strength of pelvic floor mm, hip weakness, poor body mechanics which places strain on the abdominal/pelvic floor mm. These are deficits that indicate an ineffective intraabdominal pressure system associated with increased risk for pt's Sx.                  Pt was provided education on etiology of Sx with anatomy, physiology explanation with images along with the benefits of customized pelvic PT Tx based on pt's medical conditions and musculoskeletal deficits.  Explained the physiology of deep core mm coordination and roles of pelvic floor function in urination,  defecation, sexual function, and postural control with                 deep core mm system.    Regional interdependent approaches will yield greater benefits in pt's POC due to the complexity of pt's medical Hx and the significant impact their Sx have had on their QOL. Pt would benefit from a biopsychosocial approach to yield optimal outcomes. Plan to build interdisciplinary team with pt's providers to optimize patient-centered care.    Following Tx today which pt tolerated without complaints,  pt demo'd proper body mechanics to minimize straining pelvic floor with sit to stand and plan to address proper mechanics for bending, squatting, logrolling to minimize straining pelvic floor.    Shoe lift was placed in R shoe toe box and heel to address shorter R leg. Gait speed improved and pelvic demo'd levelled pelvis and shoulders with shoe lift in R shoe. Pt reported not discomfort and voiced excitement with returning to walking again. She used to walk 1 mile but stopped due to LBP.   Plan to assess realignment of spine/ pelvis at next session to help promote optimize IAP system for improved pelvic floor function, trunk stability, gait, balance, stabilization with mobility tasks.  Plan to address pelvic floor issues once pelvis and  spine are realigned to yield better outcomes.   Pt benefits from skilled PT.    Pt benefits from skilled PT.    OBJECTIVE IMPAIRMENTS decreased activity tolerance, decreased coordination, decreased endurance, decreased mobility, difficulty walking, decreased ROM, decreased strength, decreased safety awareness, hypomobility, increased muscle spasms, impaired flexibility, improper body mechanics, postural dysfunction, and pain, scar restrictions   ACTIVITY LIMITATIONS  self-care,  , home chores,   PARTICIPATION LIMITATIONS:  community   PERSONAL FACTORS   Hx of hysterectomy and scoliosis      are also affecting patient's functional outcome.    REHAB POTENTIAL: Good    CLINICAL DECISION MAKING: Evolving/moderate complexity   EVALUATION COMPLEXITY: Moderate    PATIENT EDUCATION:    Education details: Showed pt anatomy images. Explained muscles attachments/ connection, physiology of deep core system/ spinal- thoracic-pelvis-lower kinetic chain as they relate to pt's presentation, Sx, and past Hx. Explained what and how these areas of deficits need to be restored to balance and function    See Therapeutic activity / neuromuscular re-education section  Answered pt's questions.   Person educated: Patient Education method: Explanation, Demonstration, Tactile cues, Verbal cues, and Handouts Education comprehension: verbalized understanding, returned demonstration, verbal cues required, tactile cues required, and needs further education     PLAN: PT FREQUENCY: 1x/week   PT DURATION: 10 weeks   PLANNED INTERVENTIONS: Therapeutic exercises, Therapeutic activity, Neuromuscular re-education, Balance training, Gait training, Patient/Family education, Self Care, Joint mobilization, Spinal mobilization, Moist heat, Taping, and Manual therapy, dry needling.   PLAN FOR NEXT SESSION: See clinical impression for plan     GOALS: Goals reviewed with patient? Yes  SHORT TERM GOALS: Target date: 07/16/2023    Pt will demo IND with HEP                    Baseline: Not IND            Goal status: INITIAL   LONG TERM GOALS: Target date: 08/27/2023    1.Pt will demo proper deep core coordination without chest breathing and optimal excursion of diaphragm/pelvic floor in order to promote spinal stability and pelvic floor function  Baseline: dyscoordination Goal status: INITIAL  2.  Pt will demo proper body mechanics in against gravity tasks and ADLs  work tasks, fitness  to minimize straining pelvic floor / back    Baseline: not IND, improper form that places strain on pelvic floor  Goal status: INITIAL    3. Pt will demo increased gait speed > 1.3 m/s  with reciprocal gait pattern, longer stride length  in order to ambulate safely in community and return to walking 1 mile without LBP  Baseline: 0.95 m/s , R trunk lean, decreased stance on RLE  Goal status: INITIAL    4. Pt will report no urinary leakage across one week and no longer delay to urinate   Baseline: Urinary leakage:  2 x a week with leakage before making it to bathroom if she sits too long or wait too long  Goal status: INITIAL   5 pt will report   LBP with bending, squatting , reaching up higher into cabinet decreased pain < 2/10 to perform ADLs  Baseline:  LBP with bending, squatting , reaching up higher into cabinet, 10/10  Goal status: INITIAL   6. Pt will return to walking 1 miles daily for fitness  Baseline: stopped walking laps due to LBP  Goal status: INITIAL  7. Pt will improve PFDI-7 questionnaire to  pts  score  to demo improved QOL  Baseline:    ( greater pts indicate greater negative impact on QOL)    66 pts  ( total)    33 pts  ( UIQ-7 )    38 pts  ( CRAIQ-7 )     0 pts  ( POPIQ-7 )  Goal Status: INITIAL   8 Pt will demo levelled pelvic girdle and shoulder height in order to progress to deep core strengthening HEP and restore mobility at spine, pelvis, gait, posture minimize falls, and improve balance   Baseline:  R shoulder and R iliac crest lowered, scoliosis L thoracic convex, thoracic kyphosis  Goal Status: INITIAL    9. Pt ill regain spinal mobility with no pain with B rotation and L sidebend  Baseline: L sidebend with R LBP, B rotation with LBP, very limited L thoracic rotation due to scoliosis Goal Status: INITIAL   Modesto Andreas, PT 06/18/2023, 4:05 PM

## 2023-06-18 NOTE — Patient Instructions (Signed)
  Proper body mechanics with getting out of a chair to decrease strain  on back &pelvic floor   Avoid holding your breath when Getting out of the chair:  Scoot to front part of chair chair Heels behind knees, feet are hip width apart, nose over toes  Inhale like you are smelling roses Exhale to stand   __  Wear shoe lift in toe box and heel in R shoe ( remove this week if it causes pain  but it no pain  replace every 6 months

## 2023-06-26 ENCOUNTER — Ambulatory Visit: Payer: Medicaid Other | Attending: Internal Medicine | Admitting: Physical Therapy

## 2023-06-26 DIAGNOSIS — M4125 Other idiopathic scoliosis, thoracolumbar region: Secondary | ICD-10-CM | POA: Diagnosis present

## 2023-06-26 DIAGNOSIS — M2170 Unequal limb length (acquired), unspecified site: Secondary | ICD-10-CM | POA: Insufficient documentation

## 2023-06-26 DIAGNOSIS — R2689 Other abnormalities of gait and mobility: Secondary | ICD-10-CM | POA: Diagnosis present

## 2023-06-26 NOTE — Patient Instructions (Signed)
   Brushing R  arm with 3/4 turn onto pillow behind back  Lying on L  side ,Pillow/ Block between knees     dragging top forearm across ribs below breast rotating 3/4 turn,  rotating  _R_ only this week ,  relax onto the pillow behind the back  and then back to other palm , maintain top palm on body whole top and not lift shoulder  Do this side this week       Wait do both sides until we have levelled out your spine and shoulders  ___   Avoid straining pelvic floor, abdominal muscles , spine  Use log rolling technique instead of getting out of bed with your neck or the sit-up     Log rolling into and out of bed   Log rolling into and out of bed If getting out of bed on R side, Bent knees, scoot hips/ shoulder to L  Raise R arm completely overhead, rolling onto armpit  Then lower bent knees to bed to get into complete side lying position  Then drop legs off bed, and push up onto R elbow/forearm, and use L hand to push onto the bed    Dig elbows and feet to lift hte buttocks and scoot without lifting head

## 2023-06-26 NOTE — Therapy (Signed)
 OUTPATIENT PHYSICAL THERAPY Treatment   Patient Name: Jordan Small MRN: [id number] DOB:Mar 15, 1940, 83 y.o., female Today's Date: 06/26/2023   PT End of Session - 06/26/23 1350     Visit Number 2    Number of Visits 10    Date for PT Re-Evaluation 08/27/23    PT Start Time 1333    PT Stop Time 1415    PT Time Calculation (min) 42 min    Activity Tolerance Patient tolerated treatment well;No increased pain    Behavior During Therapy WFL for tasks assessed/performed             Past Medical History:  Diagnosis Date   Anemia, unspecified    Arthritis    Diabetes mellitus without complication (HCC)    Disorder of bursae and tendons in shoulder region    unspecified   Diverticulitis    had a GI bleed with this in the past   Diverticulosis    Essential hypertension, benign    GERD (gastroesophageal reflux disease)    Heart murmur    followed by PCP   History of GI bleed    Hypertension    Mitral valve disorder    Nephrolithiasis    Osteoporosis    Pure hypercholesterolemia    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 (HCC)    unspecified type diabetes without mention of complication, not stated as uncontrolled   Past Surgical History:  Procedure Laterality Date   ABDOMINAL HYSTERECTOMY     ANTERIOR THORACIC SPINE FUSION MULTIPLE LEVELS  10/08/2016   Procedure: ARTHRODESIS, ANTERIOR INTERBODY TECHNIQUE, INCLUDING MINIMAL DISCECTOMY TO PREPARE INTERSPACE (OTHER THAN FOR DECOMPRESSION); LUMBAR; Surgeon: Rosella Conn, MD; Location: [facility] OR; Service: Neurosurgery; Laterality: N/A;   ARTHRODESIS ANTEROR CERVICAL SPINE  10/08/2016   Procedure: ARTHRODESIS, ANTERIOR INTERBODY TECHNIQUE, INCLUDING MINIMAL DISCECTOMY TO PREPARE INTERSPACE (OTHER THAN DECOMPRESSION); EACH ADDITIONAL INTERSPACE (LIST IN ADDITION TO CODE FOR PRIMARY PROCEDURE); Surgeon: Rosella Conn, MD; Location: [facility] OR; Service: Neurosurgery; Laterality: N/A;   BIOPSY   01/03/2023   Procedure: BIOPSY;  Surgeon: Shane Darling, MD;  Location: [facility] ENDOSCOPY;  Service: Endoscopy;;   BREAST EXCISIONAL BIOPSY Left 1970   neg   CATARACT EXTRACTION W/PHACO Right 10/18/2015   Procedure: CATARACT EXTRACTION PHACO AND INTRAOCULAR LENS PLACEMENT (IOC);  Surgeon: Annell Kidney, MD;  Location: [facility];  Service: Ophthalmology;  Laterality: Right;  DIABETIC - oral meds KEEP PT TIME AFTER 8 PER PT KEEP 5TH   COLONOSCOPY WITH PROPOFOL  N/A 07/18/2017   Procedure: COLONOSCOPY WITH PROPOFOL ;  Surgeon: Cassie Click, MD;  Location: [facility] ENDOSCOPY;  Service: Endoscopy;  Laterality: N/A;   COLONOSCOPY WITH PROPOFOL  N/A 01/03/2023   Procedure: COLONOSCOPY WITH PROPOFOL ;  Surgeon: Shane Darling, MD;  Location: [facility] ENDOSCOPY;  Service: Endoscopy;  Laterality: N/A;   COLPOSCOPY  2017   vag colp-neg [facility]   INSTRUMENTATION POSTERIOR SPINE 3 TO 6 VERTERAL SEGMENTS  10/08/2016   Procedure: POSTERIOR SEGMENTAL INSTRUMENTATION (EG, PEDICLE FIXATION, DUAL RODS WITH MULTIPLE HOOKS AND SUBLAMINAR WIRES); 3 TO 6 VERTEBRAL SEGMENTS (LIST IN ADDITION TO PRIMARY PROCEDURE); Surgeon: Rosella Conn, MD; Location: [facility] OR; Service: Neurosurgery; Laterality: N/A;   PARTIAL HYSTERECTOMY     in the 70's   POSTERIOR LUMBAR SPINE FUSION ONE LEVEL LATERAL TRANSVERSE TECHNIQUE  10/08/2016   Procedure: ARTHRODESIS, POSTERIOR OR POSTEROLATERAL TECHNIQUE, SINGLE LEVEL; LUMBAR (WITH LATERAL TRANSVERSE TECHNIQUE, WHEN PERFORMED); Surgeon: Rosella Conn, MD; Location: [facility] OR; Service: Neurosurgery; Laterality: N/A;   STRESS FRACTURE  OF RIGHT FOOT     TOTAL VAGINAL HYSTERECTOMY     Patient Active Problem List   Diagnosis Date Noted   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 Pt has returned to line dancing and no LBP                                                                                                                                                                                   SUBJECTIVE STATEMENT on Eval 06/18/23 : 1) Runny stools: Pt has had runny stools for the past 2 years. Pt started a medication Fibercon which has helped "a lot" for pt to make firmer stool.  Prior to this medication, pt had to change changes 5 x a day for stool leakage  was embarrassing."  Stool consistency Type 7 in the past and now with medication Stool Type 4 - 5 . Pt now changes pads 1 x day when she has leakage before making it to the bathroom for urine.  Drinks 3- 16 fl bottle of water, no sdoa, herbal teas / lemon/ honey, no coffee , no juice   2) Urinary leakage:  2 x a week with leakage before making it to bathroom if she sits too long or wait too long   3) Low back pain B: It used to radiate down leg but it does not anymore. Pain occurs with bending, squatting , reaching up higher into cabinet, 10/10 .  Pt used to walk 1 miles but she stopped due to LBP    PERTINENT HISTORY:  Abdominal hysterectomy Date Unknown Partial hysterectomy   Lumbar surgery    PAIN:  Are you having pain? Yes: see above   PRECAUTIONS: No   WEIGHT BEARING RESTRICTIONS: No  FALLS:  Has patient fallen in last 6 months? No   LIVING ENVIRONMENT: Lives with: alone  Lives in: one story home  Stairs: 3 STE with rail   Has following equipment at home: [facility] but does not use,   OCCUPATION:  substitute teacher   PLOF: IND   PATIENT GOALS:  Help improve the muscles    OBJECTIVE:    [facility] PT Assessment - 06/26/23 1353       Palpation   Spinal mobility with shoe lift in R shoe toe box and heel, pt demo'd levelled pelvis,  R shoudler slightly higher still, L lumbar convex curve present    Palpation comment tightness and tendneress along L T/L junciton, paraspinal mm tightenss L > R,              [facility] Adult PT Treatment/Exercise - 06/26/23 1412       Self-Care   Other Self-Care Comments  provided shoe lift in R shoe of another pair of tennise which pt brought in      Neuro Re-ed    Neuro Re-ed Details  cued for asymmetrical mobility thoracic HEP to promote realignment of spine      Modalities   Modalities Moist Heat      Moist Heat Therapy   Number Minutes Moist Heat 5 Minutes    Moist Heat Location --   R thoracic rotation, ( heat pack behind throacic ( unbilled)     Manual Therapy   Manual therapy comments STM/MWM at problem areas noted in assessment to realign T/L junction               HOME EXERCISE PROGRAM: See pt instruction section    ASSESSMENT:  CLINICAL IMPRESSION:   Improvements:   Shoe lift was placed in R shoe toe box and heel to address shorter R leg on 1st visit. Pt reported she loves wearing her shoe lift in R shoe/  Pt has Pt reports her LBP pain decreased from 10/10 to 7/10. Pt has returned to line dancing and no LBP   Today, addressed realignment of spine/ pelvis to help promote optimize IAP system for improved pelvic floor function, trunk stability, gait, balance, stabilization with mobility tasks.  Plan to address pelvic floor issues once pelvis and spine are realigned to yield better outcomes.  Gentle manual Tx was applied, technique modified to accommodate pt's comfort . After Tx, pt demo'd improved mobility at T/L junction. Cued for stretch to maintain today's  structural changes.  Plan to further address thoracic  kyphosis which will help with IAP function and improve incontinence, loose stools, LBP  Pt benefits from skilled PT.    OBJECTIVE IMPAIRMENTS decreased activity tolerance, decreased coordination, decreased endurance, decreased mobility, difficulty walking, decreased ROM, decreased strength, decreased safety awareness, hypomobility, increased muscle spasms, impaired flexibility, improper body mechanics, postural dysfunction, and pain, scar restrictions   ACTIVITY LIMITATIONS  self-care,  , home chores,   PARTICIPATION LIMITATIONS:  community   PERSONAL FACTORS   Hx of hysterectomy and scoliosis      are also affecting patient's functional outcome.    REHAB POTENTIAL: Good   CLINICAL DECISION MAKING: Evolving/moderate complexity   EVALUATION COMPLEXITY: Moderate    PATIENT EDUCATION:    Education details: Showed pt anatomy images. Explained muscles attachments/ connection, physiology of deep core system/ spinal- thoracic-pelvis-lower kinetic chain as they relate to pt's presentation, Sx, and past Hx. Explained what and how these areas of deficits need to be restored to balance and function    See Therapeutic activity / neuromuscular re-education section  Answered pt's questions.   Person educated: Patient Education method: Explanation, Demonstration, Tactile cues, Verbal cues, and Handouts Education comprehension: verbalized understanding, returned demonstration, verbal cues required, tactile cues required, and needs further education     PLAN: PT FREQUENCY: 1x/week  PT DURATION: 10 weeks   PLANNED INTERVENTIONS: Therapeutic exercises, Therapeutic activity, Neuromuscular re-education, Balance training, Gait training, Patient/Family education, Self Care, Joint mobilization, Spinal mobilization, Moist heat, Taping, and Manual therapy, dry needling.   PLAN FOR NEXT SESSION: See clinical impression for plan     GOALS: Goals reviewed with patient? Yes  SHORT TERM GOALS:  Target date: 07/16/2023    Pt will demo IND with HEP                    Baseline: Not IND            Goal status: INITIAL   LONG TERM GOALS: Target date: 08/27/2023    1.Pt will demo proper deep core coordination without chest breathing and optimal excursion of diaphragm/pelvic floor in order to promote spinal stability and pelvic floor function  Baseline: dyscoordination Goal status: INITIAL  2.  Pt will demo proper body mechanics in against gravity tasks and ADLs  work tasks, fitness  to minimize straining pelvic floor / back    Baseline: not IND, improper form that places strain on pelvic floor  Goal status: INITIAL    3. Pt will demo increased gait speed > 1.3 m/s with reciprocal gait pattern, longer stride length  in order to ambulate safely in community and return to walking 1 mile without LBP  Baseline: 0.95 m/s , R trunk lean, decreased stance on RLE  Goal status: INITIAL    4. Pt will report no urinary leakage across one week and no longer delay to urinate   Baseline: Urinary leakage:  2 x a week with leakage before making it to bathroom if she sits too long or wait too long  Goal status: INITIAL   5 pt will report   LBP with bending, squatting , reaching up higher into cabinet decreased pain < 2/10 to perform ADLs  Baseline:  LBP with bending, squatting , reaching up higher into cabinet, 10/10  Goal status: INITIAL   6. Pt will return to walking 1 miles daily for fitness  Baseline: stopped walking laps due to LBP  Goal status: INITIAL  7. Pt will improve PFDI-7 questionnaire to  pts  score  to demo improved QOL  Baseline:    ( greater pts indicate greater negative impact on QOL)    66 pts  ( total)    33 pts  ( UIQ-7 )    38 pts  ( CRAIQ-7 )     0 pts  ( POPIQ-7 )  Goal Status: INITIAL   8 Pt will demo levelled pelvic girdle and shoulder height in order to progress to deep core strengthening HEP and restore mobility at spine, pelvis, gait, posture minimize  falls, and improve balance   Baseline:  R shoulder and R iliac crest lowered, scoliosis L thoracic convex, thoracic kyphosis  Goal Status: INITIAL    9. Pt ill regain spinal mobility with no pain with B rotation and L sidebend  Baseline: L sidebend with R LBP, B rotation with LBP, very limited L thoracic rotation due to scoliosis Goal Status: INITIAL   Modesto Andreas, PT 06/26/2023, 1:51 PM

## 2023-06-30 ENCOUNTER — Other Ambulatory Visit: Payer: Self-pay | Admitting: Internal Medicine

## 2023-06-30 DIAGNOSIS — Z1231 Encounter for screening mammogram for malignant neoplasm of breast: Secondary | ICD-10-CM

## 2023-07-02 ENCOUNTER — Ambulatory Visit: Admitting: Physical Therapy

## 2023-07-02 DIAGNOSIS — R2689 Other abnormalities of gait and mobility: Secondary | ICD-10-CM

## 2023-07-02 DIAGNOSIS — M4125 Other idiopathic scoliosis, thoracolumbar region: Secondary | ICD-10-CM

## 2023-07-02 DIAGNOSIS — M2170 Unequal limb length (acquired), unspecified site: Secondary | ICD-10-CM

## 2023-07-02 NOTE — Patient Instructions (Signed)
Feet care :  Self -feet massage   Handshake : fingers between toes, moving ballmounds/toes back and forth several times while other hand anchors at arch. Do the same at the hind/mid foot.  Heel to toes upward to a letter Big Letter T strokes to spread ballmounds and toes, several times, pinch between webs of toes  Run finger tips along top of foot between long bones "comb between the bones"    Wiggle toes and spread them out when relaxing    __   Feet slides :   Points of contact at sitting bones  Four points of contact of foot,  Side knee back while keeping knee out along 2-3rd toe line   Heel up, ankle not twist out Lower heel while keeping knee out along 2-3rd toe line Four points of contact of foot, Slide foot back while keeping knee out along 2-3rd toe line   Repeated with other foot

## 2023-07-02 NOTE — Therapy (Signed)
 OUTPATIENT PHYSICAL THERAPY Treatment   Patient Name: Jordan Small MRN: [id number] DOB:05-26-1940, 83 y.o., female Today's Date: 07/02/2023   PT End of Session - 07/02/23 1512     Visit Number 3    Number of Visits 10    Date for PT Re-Evaluation 08/27/23    PT Start Time 1510    PT Stop Time 1550    PT Time Calculation (min) 40 min             Past Medical History:  Diagnosis Date   Anemia, unspecified    Arthritis    Diabetes mellitus without complication (HCC)    Disorder of bursae and tendons in shoulder region    unspecified   Diverticulitis    had a GI bleed with this in the past   Diverticulosis    Essential hypertension, benign    GERD (gastroesophageal reflux disease)    Heart murmur    followed by PCP   History of GI bleed    Hypertension    Mitral valve disorder    Nephrolithiasis    Osteoporosis    Pure hypercholesterolemia    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 (HCC)    unspecified type diabetes without mention of complication, not stated as uncontrolled   Past Surgical History:  Procedure Laterality Date   ABDOMINAL HYSTERECTOMY     ANTERIOR THORACIC SPINE FUSION MULTIPLE LEVELS  10/08/2016   Procedure: ARTHRODESIS, ANTERIOR INTERBODY TECHNIQUE, INCLUDING MINIMAL DISCECTOMY TO PREPARE INTERSPACE (OTHER THAN FOR DECOMPRESSION); LUMBAR; Surgeon: Rosella Conn, MD; Location: [facility] OR; Service: Neurosurgery; Laterality: N/A;   ARTHRODESIS ANTEROR CERVICAL SPINE  10/08/2016   Procedure: ARTHRODESIS, ANTERIOR INTERBODY TECHNIQUE, INCLUDING MINIMAL DISCECTOMY TO PREPARE INTERSPACE (OTHER THAN DECOMPRESSION); EACH ADDITIONAL INTERSPACE (LIST IN ADDITION TO CODE FOR PRIMARY PROCEDURE); Surgeon: Rosella Conn, MD; Location: [facility] OR; Service: Neurosurgery; Laterality: N/A;   BIOPSY  01/03/2023   Procedure: BIOPSY;  Surgeon: Shane Darling, MD;  Location: [facility] ENDOSCOPY;  Service: Endoscopy;;   BREAST EXCISIONAL  BIOPSY Left 1970   neg   CATARACT EXTRACTION W/PHACO Right 10/18/2015   Procedure: CATARACT EXTRACTION PHACO AND INTRAOCULAR LENS PLACEMENT (IOC);  Surgeon: Annell Kidney, MD;  Location: [facility];  Service: Ophthalmology;  Laterality: Right;  DIABETIC - oral meds KEEP PT TIME AFTER 8 PER PT KEEP 5TH   COLONOSCOPY WITH PROPOFOL  N/A 07/18/2017   Procedure: COLONOSCOPY WITH PROPOFOL ;  Surgeon: Cassie Click, MD;  Location: [facility] ENDOSCOPY;  Service: Endoscopy;  Laterality: N/A;   COLONOSCOPY WITH PROPOFOL  N/A 01/03/2023   Procedure: COLONOSCOPY WITH PROPOFOL ;  Surgeon: Shane Darling, MD;  Location: [facility] ENDOSCOPY;  Service: Endoscopy;  Laterality: N/A;   COLPOSCOPY  2017   vag colp-neg TVH   INSTRUMENTATION POSTERIOR SPINE 3 TO 6 VERTERAL SEGMENTS  10/08/2016   Procedure: POSTERIOR SEGMENTAL INSTRUMENTATION (EG, PEDICLE FIXATION, DUAL RODS WITH MULTIPLE HOOKS AND SUBLAMINAR WIRES); 3 TO 6 VERTEBRAL SEGMENTS (LIST IN ADDITION TO PRIMARY PROCEDURE); Surgeon: Rosella Conn, MD; Location: [facility] OR; Service: Neurosurgery; Laterality: N/A;   PARTIAL HYSTERECTOMY     in the 70's   POSTERIOR LUMBAR SPINE FUSION ONE LEVEL LATERAL TRANSVERSE TECHNIQUE  10/08/2016   Procedure: ARTHRODESIS, POSTERIOR OR POSTEROLATERAL TECHNIQUE, SINGLE LEVEL; LUMBAR (WITH LATERAL TRANSVERSE TECHNIQUE, WHEN PERFORMED); Surgeon: Rosella Conn, MD; Location: [facility] OR; Service: Neurosurgery; Laterality: N/A;   STRESS FRACTURE OF RIGHT FOOT     TOTAL VAGINAL HYSTERECTOMY     Patient Active Problem List   Diagnosis  Date Noted   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 It started in January.    Pt has not have any leakage since coming to PT                                                                                                                                                                                    SUBJECTIVE STATEMENT on Eval 06/18/23 : 1) Runny stools: Pt has had runny stools for the past 2 years. Pt started a medication Fibercon which has helped "a lot" for pt to make firmer stool.  Prior to this medication, pt had to change changes 5 x a day for stool leakage  was embarrassing."  Stool consistency Type 7 in the past and now with medication Stool Type 4 - 5 . Pt now changes pads 1 x day when she has leakage before making it to the bathroom for urine.  Drinks 3- 16 fl bottle of water, no sdoa, herbal teas / lemon/ honey, no coffee , no juice   2) Urinary leakage:  2 x a week with leakage before making it to bathroom if she sits too long or wait too long   3) Low back pain B: It used to radiate down leg but it does not anymore. Pain occurs with bending, squatting , reaching up higher into cabinet, 10/10 .  Pt used to walk 1 miles but she stopped due to LBP    PERTINENT HISTORY:  Abdominal hysterectomy Date Unknown Partial hysterectomy   Lumbar surgery    PAIN:  Are you having pain? Yes: see above   PRECAUTIONS: No   WEIGHT BEARING RESTRICTIONS: No  FALLS:  Has patient fallen in last 6 months? No   LIVING ENVIRONMENT: Lives with: alone  Lives in: one story home  Stairs: 3 STE with rail   Has following equipment at home: [facility] but does not use,   OCCUPATION: substitute teacher   PLOF: IND   PATIENT GOALS:  Help  improve the  muscles    OBJECTIVE:     [facility] PT Assessment - 07/02/23 1519       AROM   Overall AROM Comments DF 80 degL, 70 deg R ( post Tx: L 70 deg ) ,      Palpation   Palpation comment hypomobile midfoot on L ( longer leg)  , tightness  plantar fascia, lateral leg, dorsal interreous             [facility] Adult PT Treatment/Exercise - 07/02/23 1557       Neuro Re-ed    Neuro Re-ed Details  cued for anke, midfoot stretches, feet massage to promote L foot and ankle mobility ( pt's longer leg)      Manual Therapy   Manual therapy comments distraction at talocrural / tibfib, STM/MWM at plantar fascia, lateral leg, dorsal interreous                  HOME EXERCISE PROGRAM: See pt instruction section    ASSESSMENT:  CLINICAL IMPRESSION:   Improvements:   Shoe lift was placed in R shoe toe box and heel to address shorter R leg on 1st visit. Pt reported she loves wearing her shoe lift in R shoe/  Pt has Pt reports her LBP pain decreased from 10/10 to 7/10. Pt has returned to line dancing and no LBP  Pt has not have any urinary leakage since coming to PT   Today, L foot deficits and pain on L which is her longer leg.  Pt reported decreased dorsal foot pain on L post Tx after manual Tx helped ot regain DF , toe abduction, midfoot  mobility.  Modified pressure to accommodate comfort.  Discontinued manual Tx when pt requested,    Cued for stretches and feet massage for HEP.   Anticipate with shoe lift in R shoe will help with shorter leg  and improved mobility at L foot will help with L foot pain related to longer pain.   Levelling pelvic alignment and spine help to promote optimize IAP system for improved pelvic floor function, trunk stability, gait, balance, stabilization with mobility tasks.  Plan to add deep core training next session  Plan to further address thoracic kyphosis which will help with IAP function and improve incontinence, loose stools, LBP  Pt benefits  from skilled PT.    OBJECTIVE IMPAIRMENTS decreased activity tolerance, decreased coordination, decreased endurance, decreased mobility, difficulty walking, decreased ROM, decreased strength, decreased safety awareness, hypomobility, increased muscle spasms, impaired flexibility, improper body mechanics, postural dysfunction, and pain, scar restrictions   ACTIVITY LIMITATIONS  self-care,  , home chores,   PARTICIPATION LIMITATIONS:  community   PERSONAL FACTORS   Hx of hysterectomy and scoliosis      are also affecting patient's functional outcome.    REHAB POTENTIAL: Good   CLINICAL DECISION MAKING: Evolving/moderate complexity   EVALUATION COMPLEXITY: Moderate    PATIENT EDUCATION:    Education details: Showed pt anatomy images. Explained muscles attachments/ connection, physiology of deep core system/ spinal- thoracic-pelvis-lower kinetic chain as they relate to pt's presentation, Sx, and past Hx. Explained what and how these areas of deficits need to be restored to balance and function    See Therapeutic activity / neuromuscular re-education section  Answered pt's questions.   Person educated: Patient Education method: Explanation, Demonstration, Tactile cues, Verbal cues, and Handouts Education comprehension: verbalized understanding, returned demonstration, verbal cues required, tactile cues required, and needs further education     PLAN: PT FREQUENCY: 1x/week  PT DURATION: 10 weeks   PLANNED INTERVENTIONS: Therapeutic exercises, Therapeutic activity, Neuromuscular re-education, Balance training, Gait training, Patient/Family education, Self Care, Joint mobilization, Spinal mobilization, Moist heat, Taping, and Manual therapy, dry needling.   PLAN FOR NEXT SESSION: See clinical impression for plan     GOALS: Goals reviewed with patient? Yes  SHORT TERM GOALS: Target date: 07/16/2023    Pt will demo IND with HEP                    Baseline: Not IND             Goal status: INITIAL   LONG TERM GOALS: Target date: 08/27/2023    1.Pt will demo proper deep core coordination without chest breathing and optimal excursion of diaphragm/pelvic floor in order to promote spinal stability and pelvic floor function  Baseline: dyscoordination Goal status: INITIAL  2.  Pt will demo proper body mechanics in against gravity tasks and ADLs  work tasks, fitness  to minimize straining pelvic floor / back    Baseline: not IND, improper form that places strain on pelvic floor  Goal status: INITIAL    3. Pt will demo increased gait speed > 1.3 m/s with reciprocal gait pattern, longer stride length  in order to ambulate safely in community and return to walking 1 mile without LBP  Baseline: 0.95 m/s , R trunk lean, decreased stance on RLE  Goal status: INITIAL    4. Pt will report no urinary leakage across one week and no longer delay to urinate   Baseline: Urinary leakage:  2 x a week with leakage before making it to bathroom if she sits too long or wait too long  Goal status: INITIAL   5 pt will report   LBP with bending, squatting , reaching up higher into cabinet decreased pain < 2/10 to perform ADLs  Baseline:  LBP with bending, squatting , reaching up higher into cabinet, 10/10  Goal status: INITIAL   6. Pt will return to walking 1 miles daily for fitness  Baseline: stopped walking laps due to LBP  Goal status: INITIAL  7. Pt will improve PFDI-7 questionnaire to  pts  score  to demo improved QOL  Baseline:    ( greater pts indicate greater negative impact on QOL)    66 pts  ( total)    33 pts  ( UIQ-7 )    38 pts  ( CRAIQ-7 )     0 pts  ( POPIQ-7 )  Goal Status: INITIAL   8 Pt will demo levelled pelvic girdle and shoulder height in order to progress to deep core strengthening HEP and restore mobility at spine, pelvis, gait, posture minimize falls, and improve balance   Baseline:  R shoulder and R iliac crest lowered, scoliosis L thoracic  convex, thoracic kyphosis  Goal Status: INITIAL    9. Pt ill regain spinal mobility with no pain with B rotation and L sidebend  Baseline: L sidebend with R LBP, B rotation with LBP, very limited L thoracic rotation due to scoliosis Goal Status: INITIAL   Modesto Andreas, PT 07/02/2023, 3:13 PM

## 2023-07-03 ENCOUNTER — Encounter: Payer: Medicaid Other | Admitting: Physical Therapy

## 2023-07-21 ENCOUNTER — Ambulatory Visit: Attending: Internal Medicine | Admitting: Physical Therapy

## 2023-07-21 DIAGNOSIS — R2689 Other abnormalities of gait and mobility: Secondary | ICD-10-CM | POA: Diagnosis present

## 2023-07-21 DIAGNOSIS — M4125 Other idiopathic scoliosis, thoracolumbar region: Secondary | ICD-10-CM | POA: Diagnosis present

## 2023-07-21 DIAGNOSIS — M2170 Unequal limb length (acquired), unspecified site: Secondary | ICD-10-CM

## 2023-07-21 NOTE — Therapy (Signed)
 OUTPATIENT PHYSICAL THERAPY Treatment / Discharge Summary across 4 visits  Patient Name: Jordan Small MRN: [id number] DOB:1940/11/07, 83 y.o., female Today's Date: 07/21/2023   PT End of Session - 07/21/23 1155     Visit Number 4    Number of Visits 10    Date for PT Re-Evaluation 08/27/23    PT Start Time 1150    PT Stop Time 1230    PT Time Calculation (min) 40 min             Past Medical History:  Diagnosis Date   Anemia, unspecified    Arthritis    Diabetes mellitus without complication (HCC)    Disorder of bursae and tendons in shoulder region    unspecified   Diverticulitis    had a GI bleed with this in the past   Diverticulosis    Essential hypertension, benign    GERD (gastroesophageal reflux disease)    Heart murmur    followed by PCP   History of GI bleed    Hypertension    Mitral valve disorder    Nephrolithiasis    Osteoporosis    Pure hypercholesterolemia    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 (HCC)    unspecified type diabetes without mention of complication, not stated as uncontrolled   Past Surgical History:  Procedure Laterality Date   ABDOMINAL HYSTERECTOMY     ANTERIOR THORACIC SPINE FUSION MULTIPLE LEVELS  10/08/2016   Procedure: ARTHRODESIS, ANTERIOR INTERBODY TECHNIQUE, INCLUDING MINIMAL DISCECTOMY TO PREPARE INTERSPACE (OTHER THAN FOR DECOMPRESSION); LUMBAR; Surgeon: Rosella Conn, MD; Location: [facility] OR; Service: Neurosurgery; Laterality: N/A;   ARTHRODESIS ANTEROR CERVICAL SPINE  10/08/2016   Procedure: ARTHRODESIS, ANTERIOR INTERBODY TECHNIQUE, INCLUDING MINIMAL DISCECTOMY TO PREPARE INTERSPACE (OTHER THAN DECOMPRESSION); EACH ADDITIONAL INTERSPACE (LIST IN ADDITION TO CODE FOR PRIMARY PROCEDURE); Surgeon: Rosella Conn, MD; Location: [facility] OR; Service: Neurosurgery; Laterality: N/A;   BIOPSY  01/03/2023   Procedure: BIOPSY;  Surgeon: Shane Darling, MD;  Location: [facility] ENDOSCOPY;   Service: Endoscopy;;   BREAST EXCISIONAL BIOPSY Left 1970   neg   CATARACT EXTRACTION W/PHACO Right 10/18/2015   Procedure: CATARACT EXTRACTION PHACO AND INTRAOCULAR LENS PLACEMENT (IOC);  Surgeon: Annell Kidney, MD;  Location: [facility];  Service: Ophthalmology;  Laterality: Right;  DIABETIC - oral meds KEEP PT TIME AFTER 8 PER PT KEEP 5TH   COLONOSCOPY WITH PROPOFOL  N/A 07/18/2017   Procedure: COLONOSCOPY WITH PROPOFOL ;  Surgeon: Cassie Click, MD;  Location: [facility] ENDOSCOPY;  Service: Endoscopy;  Laterality: N/A;   COLONOSCOPY WITH PROPOFOL  N/A 01/03/2023   Procedure: COLONOSCOPY WITH PROPOFOL ;  Surgeon: Shane Darling, MD;  Location: [facility] ENDOSCOPY;  Service: Endoscopy;  Laterality: N/A;   COLPOSCOPY  2017   vag colp-neg TVH   INSTRUMENTATION POSTERIOR SPINE 3 TO 6 VERTERAL SEGMENTS  10/08/2016   Procedure: POSTERIOR SEGMENTAL INSTRUMENTATION (EG, PEDICLE FIXATION, DUAL RODS WITH MULTIPLE HOOKS AND SUBLAMINAR WIRES); 3 TO 6 VERTEBRAL SEGMENTS (LIST IN ADDITION TO PRIMARY PROCEDURE); Surgeon: Rosella Conn, MD; Location: [facility] OR; Service: Neurosurgery; Laterality: N/A;   PARTIAL HYSTERECTOMY     in the 70's   POSTERIOR LUMBAR SPINE FUSION ONE LEVEL LATERAL TRANSVERSE TECHNIQUE  10/08/2016   Procedure: ARTHRODESIS, POSTERIOR OR POSTEROLATERAL TECHNIQUE, SINGLE LEVEL; LUMBAR (WITH LATERAL TRANSVERSE TECHNIQUE, WHEN PERFORMED); Surgeon: Rosella Conn, MD; Location: 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 It started in January.    Pt has not have any leakage since coming to PT                                                                                                                                                                                    SUBJECTIVE STATEMENT on Eval 06/18/23 : 1) Runny stools: Pt has had runny stools for the past 2 years. Pt started a medication Fibercon which has helped "a lot" for pt to make firmer stool.  Prior to this medication, pt had to change changes 5 x a day for stool leakage  was embarrassing."  Stool consistency Type 7 in the past and now with medication Stool Type 4 - 5 . Pt now changes pads 1 x day when she has leakage before making it to the bathroom for urine.  Drinks 3- 16 fl bottle of water, no sdoa, herbal teas / lemon/ honey, no coffee , no juice   2) Urinary leakage:  2 x a week with leakage before making it to bathroom if she sits too long or wait too long   3) Low back pain B: It used to radiate down leg but it does not anymore. Pain occurs with bending, squatting , reaching up higher into cabinet, 10/10 .  Pt used to walk 1 miles but she stopped due to LBP    PERTINENT HISTORY:  Abdominal hysterectomy Date Unknown Partial hysterectomy   Lumbar surgery    PAIN:  Are you having pain? Yes: see above   PRECAUTIONS: No   WEIGHT BEARING RESTRICTIONS: No  FALLS:  Has patient fallen in last 6 months? No   LIVING ENVIRONMENT: Lives with: alone  Lives in: one story home  Stairs: 3 STE with rail   Has following equipment at home: [facility] but does not use,   OCCUPATION: substitute teacher    PLOF: IND   PATIENT  GOALS:  Help improve the muscles    OBJECTIVE:     [facility] PT Assessment - 07/21/23 1305       AROM   Overall AROM Comments no LBP with L sidebend, restored AROM in four directions      Ambulation/Gait   Gait Comments 1.22 m/s , reciprocal gait, no more trunk lean             [facility] Adult PT Treatment/Exercise - 07/21/23 1305       Therapeutic Activites    Therapeutic Activities Other Therapeutic Activities    Other Therapeutic Activities reassessed goals, cued for use of LKC for reaching up into cabinet      Neuro Re-ed    Neuro Re-ed Details  cued for deep core leve l 1-2                HOME EXERCISE PROGRAM: See pt instruction section    ASSESSMENT:  CLINICAL IMPRESSION:  Pt achieved 100% of her goals   Improvements include:   Shoe lift was placed in R shoe toe box and heel to address shorter R leg on 1st visit. Pt reported she loves wearing her shoe lift in R shoe/  Pt has Pt reports her LBP pain decrease. Pt has returned to line dancing and no LBP Pt has not have any urinary leakage since coming to PT  Pt is IND with body  mechanics to minimize relapse of Sx Pt's gait speed increased with shoe lift addressing leg length difference.   PFDI-7 score improved significantly 66pts to 0 pts.  Pt no longer has  LBP with L sidebend and has restored AROM in four directions    Pt is ready for d/c today   OBJECTIVE IMPAIRMENTS decreased activity tolerance, decreased coordination, decreased endurance, decreased mobility, difficulty walking, decreased ROM, decreased strength, decreased safety awareness, hypomobility, increased muscle spasms, impaired flexibility, improper body mechanics, postural dysfunction, and pain, scar restrictions   ACTIVITY LIMITATIONS  self-care,  , home chores,   PARTICIPATION LIMITATIONS:  community   PERSONAL FACTORS   Hx of hysterectomy and scoliosis      are also affecting patient's functional outcome.     REHAB POTENTIAL: Good   CLINICAL DECISION MAKING: Evolving/moderate complexity   EVALUATION COMPLEXITY: Moderate    PATIENT EDUCATION:    Education details: Showed pt anatomy images. Explained muscles attachments/ connection, physiology of deep core system/ spinal- thoracic-pelvis-lower kinetic chain as they relate to pt's presentation, Sx, and past Hx. Explained what and how these areas of deficits need to be restored to balance and function    See Therapeutic activity / neuromuscular re-education section  Answered pt's questions.   Person educated: Patient Education method: Explanation, Demonstration, Tactile cues, Verbal cues, and Handouts Education comprehension: verbalized understanding, returned demonstration, verbal cues required, tactile cues required, and needs further education     PLAN: PT FREQUENCY: 1x/week   PT DURATION: 10 weeks   PLANNED INTERVENTIONS: Therapeutic exercises, Therapeutic activity, Neuromuscular re-education, Balance training, Gait training, Patient/Family education, Self Care, Joint mobilization, Spinal mobilization, Moist heat, Taping, and Manual therapy, dry needling.   PLAN FOR NEXT SESSION: See clinical impression for plan     GOALS: Goals reviewed with patient? Yes  SHORT TERM GOALS: Target date: 07/16/2023    Pt will demo IND with HEP                    Baseline: Not IND  Goal status: INITIAL   LONG TERM GOALS: Target date: 08/27/2023    1.Pt will demo proper deep core coordination without chest breathing and optimal excursion of diaphragm/pelvic floor in order to promote spinal stability and pelvic floor function  Baseline: dyscoordination Goal status: MET   2.  Pt will demo proper body mechanics in against gravity tasks and ADLs  work tasks, fitness  to minimize straining pelvic floor / back    Baseline: not IND, improper form that places strain on pelvic floor  Goal status: MET     3. Pt will demo increased gait  speed > 1.3 m/s with reciprocal gait pattern, longer stride length  in order to ambulate safely in community and return to walking 1 mile without LBP  Baseline: 0.95 m/s , R trunk lean, decreased stance on RLE  Goal status:  1.2 m/s  MET     4. Pt will report no urinary leakage across one week and no longer delay to urinate   Baseline: Urinary leakage:  2 x a week with leakage before making it to bathroom if she sits too long or wait too long  Goal status: MET    5 pt will report   LBP with bending, squatting , reaching up higher into cabinet decreased pain < 2/10 to perform ADLs  Baseline:  LBP with bending, squatting , reaching up higher into cabinet, 10/10  Goal status: MET    6. Pt will return to walking 1 miles daily for fitness  Baseline: stopped walking laps due to LBP  Goal status: Have not tried it yet   7. Pt will improve PFDI-7 questionnaire to  pts  score  to demo improved QOL  Baseline:    ( greater pts indicate greater negative impact on QOL)    66 pts  ( total)    33 pts  ( UIQ-7 )    38 pts  ( CRAIQ-7 )     0 pts  ( POPIQ-7 )  Goal Status: 0 pts for all categories MET 3  8 Pt will demo levelled pelvic girdle and shoulder height in order to progress to deep core strengthening HEP and restore mobility at spine, pelvis, gait, posture minimize falls, and improve balance   Baseline:  R shoulder and R iliac crest lowered, scoliosis L thoracic convex, thoracic kyphosis  Goal Status: MET    9. Pt ill regain spinal mobility with no pain with B rotation and L sidebend  Baseline: L sidebend with R LBP, B rotation with LBP, very limited L thoracic rotation due to scoliosis Goal Status: MET   [place] Andreas, PT 07/21/2023, 11:56 AM

## 2023-07-24 ENCOUNTER — Encounter: Payer: Medicaid Other | Admitting: Physical Therapy

## 2023-07-31 ENCOUNTER — Encounter: Payer: Medicaid Other | Admitting: Physical Therapy

## 2023-08-05 ENCOUNTER — Encounter: Payer: Medicaid Other | Admitting: Physical Therapy

## 2023-08-11 ENCOUNTER — Ambulatory Visit
Admission: RE | Admit: 2023-08-11 | Discharge: 2023-08-11 | Disposition: A | Discharge status: Home / Self Care | Source: Ambulatory Visit | Attending: Internal Medicine | Admitting: Internal Medicine

## 2023-08-11 DIAGNOSIS — Z1231 Encounter for screening mammogram for malignant neoplasm of breast: Secondary | ICD-10-CM | POA: Insufficient documentation

## 2023-08-12 ENCOUNTER — Encounter: Payer: Medicaid Other | Admitting: Physical Therapy

## 2023-08-19 ENCOUNTER — Encounter: Payer: Medicaid Other | Admitting: Physical Therapy

## 2023-08-26 ENCOUNTER — Encounter: Payer: Medicaid Other | Admitting: Physical Therapy

## 2023-09-02 ENCOUNTER — Encounter: Payer: Medicaid Other | Admitting: Physical Therapy

## 2023-09-09 ENCOUNTER — Encounter: Payer: Medicaid Other | Admitting: Physical Therapy

## 2023-09-16 ENCOUNTER — Encounter: Payer: Medicaid Other | Admitting: Physical Therapy

## 2023-09-23 ENCOUNTER — Encounter: Payer: Medicaid Other | Admitting: Physical Therapy

## 2023-09-30 ENCOUNTER — Encounter: Payer: Medicaid Other | Admitting: Physical Therapy

## 2023-11-20 ENCOUNTER — Encounter: Payer: Self-pay | Admitting: Internal Medicine

## 2023-11-20 ENCOUNTER — Other Ambulatory Visit: Payer: Self-pay | Admitting: Internal Medicine

## 2023-11-20 DIAGNOSIS — M810 Age-related osteoporosis without current pathological fracture: Secondary | ICD-10-CM

## 2024-02-12 ENCOUNTER — Emergency Department

## 2024-02-12 ENCOUNTER — Emergency Department
Admission: EM | Admit: 2024-02-12 | Discharge: 2024-02-12 | Disposition: A | Discharge status: Home / Self Care | Attending: Emergency Medicine | Admitting: Emergency Medicine

## 2024-02-12 ENCOUNTER — Other Ambulatory Visit: Payer: Self-pay

## 2024-02-12 DIAGNOSIS — I10 Essential (primary) hypertension: Secondary | ICD-10-CM | POA: Insufficient documentation

## 2024-02-12 DIAGNOSIS — W540XXA Bitten by dog, initial encounter: Secondary | ICD-10-CM | POA: Diagnosis not present

## 2024-02-12 DIAGNOSIS — E119 Type 2 diabetes mellitus without complications: Secondary | ICD-10-CM | POA: Insufficient documentation

## 2024-02-12 DIAGNOSIS — Z23 Encounter for immunization: Secondary | ICD-10-CM | POA: Diagnosis not present

## 2024-02-12 DIAGNOSIS — S61235A Puncture wound without foreign body of left ring finger without damage to nail, initial encounter: Secondary | ICD-10-CM | POA: Insufficient documentation

## 2024-02-12 DIAGNOSIS — S61259A Open bite of unspecified finger without damage to nail, initial encounter: Secondary | ICD-10-CM

## 2024-02-12 MED ORDER — AMOXICILLIN-POT CLAVULANATE 875-125 MG PO TABS: 1.0000 | ORAL_TABLET | Freq: Two times a day (BID) | ORAL | 0 refills | Status: AC

## 2024-02-12 MED ORDER — TETANUS-DIPHTH-ACELL PERTUSSIS 5-2-15.5 LF-MCG/0.5 IM SUSP
0.5000 mL | Freq: Once | INTRAMUSCULAR | Status: AC
  Administered 2024-02-12: 0.5 mL via INTRAMUSCULAR
  Filled 2024-02-12: qty 0.5

## 2024-02-12 MED ORDER — AMOXICILLIN-POT CLAVULANATE 875-125 MG PO TABS
1.0000 | ORAL_TABLET | Freq: Once | ORAL | Status: AC
  Administered 2024-02-12: 1 via ORAL
  Filled 2024-02-12: qty 1

## 2024-02-12 NOTE — ED Notes (Signed)
 [facility] was called for patient by this RN as [employer] was closed due to Holiday. Police officer at patient bedside filling out report on dog bite that will be sent via email to Animal control for them to follow up on.

## 2024-02-12 NOTE — ED Provider Notes (Signed)
 "  [facility] Provider Note    Event Date/Time   First MD Initiated Contact with Patient 02/12/24 1920     (approximate)   History   Animal Bite   HPI  Jordan Small is a 83 y.o. female  with a past medical history of diabetes, hypertension, diverticulosis, osteoporosis, nephrolithiasis, GERD, radiculopathy, unspecified heart murmur presents to the emergency department with animal bite, specifically a dog to the left ring finger on the anterior aspect that happened today.  Patient's son is present with her.  The dog is the patient's son's wife's dog.  They are up-to-date on all rabies vaccines.  Patient is not sure when her last tetanus vaccine was.  Patient has no antibiotic allergies.  They have not yet contacted animal control.  Patient denies fever, chills, erythema or puslike drainage from the area.  She does report some swelling of her affected finger and feeling like it is throbbing.  She denies any bite to other parts of her left upper extremity and states the dog just brushed up against that side of her. Patient does not take any blood thinners.  Physical Exam   Triage Vital Signs: ED Triage Vitals  Encounter Vitals Group     BP 02/12/24 1805 (!) 167/81     Girls Systolic BP Percentile --      Girls Diastolic BP Percentile --      Boys Systolic BP Percentile --      Boys Diastolic BP Percentile --      Pulse Rate 02/12/24 1805 77     Resp 02/12/24 1805 20     Temp 02/12/24 1805 99 F (37.2 C)     Temp Source 02/12/24 1805 Oral     SpO2 02/12/24 1805 96 %     Weight 02/12/24 1806 123 lb (55.8 kg)     Height 02/12/24 1806 5' (1.524 m)     Head Circumference --      Peak Flow --      Pain Score 02/12/24 1805 10     Pain Loc --      Pain Education --      Exclude from Growth Chart --     Most recent vital signs: Vitals:   02/12/24 1805  BP: (!) 167/81  Pulse: 77  Resp: 20  Temp: 99 F (37.2 C)  SpO2: 96%    General: Awake, in no  acute distress. Appears stated age. Head: Normocephalic, atraumatic. CV: Good peripheral perfusion. Radial pulses 2+ bilaterally. Respiratory:Normal respiratory effort.  No respiratory distress.  GI: Soft, non-distended. MSK: Normal ROM and  5/5 strength in all left fingers and wrist. Mild swelling noted to two bleeding puncture wounds on anterior aspect of left ring finger between PIP and MCP joints. No pus-like drainage. Skin:Warm, dry, intact. No wounds noted to any other aspects of left UE.  ED Results / Procedures / Treatments   Labs (all labs ordered are listed, but only abnormal results are displayed) Labs Reviewed - No data to display   EKG     RADIOLOGY X ray left hand FINDINGS: Rings obscure the proximal phalanx of the second, third, and fourth digits. No evidence of acute fracture or dislocation. Minor osteoarthritis. No erosive change there is skin and soft tissue irregularity about the ulnar aspect of the ring finger adjacent to the middle phalanx. No radiopaque foreign body.  IMPRESSION: Skin and soft tissue irregularity about the ulnar aspect of the ring finger adjacent to the  middle phalanx. No radiopaque foreign body. No acute osseous abnormality.   PROCEDURES:  Critical Care performed: No   Procedures   MEDICATIONS ORDERED IN ED: Medications  amoxicillin -clavulanate (AUGMENTIN ) 875-125 MG per tablet 1 tablet (1 tablet Oral Given 02/12/24 2033)  Tdap (ADACEL ) injection 0.5 mL (0.5 mLs Intramuscular Given 02/12/24 2032)     IMPRESSION / MDM / ASSESSMENT AND PLAN / ED COURSE  I reviewed the triage vital signs and the nursing notes.                              Differential diagnosis includes, but is not limited to, animal bite, laceration, abrasion, left ring finger fracture  Patient's presentation is most consistent with acute complicated illness / injury requiring diagnostic workup.  Patient is an 83 year old female presenting following an  animal bite that occurred today on her left ring finger.  She is neurovascularly intact with good range of motion of the left hand.  Tetanus was updated.  X-ray of the left hand was ordered. I independently viewed the x-ray and radiologist's report.  I agree with the radiologist's report there is no acute fracture or dislocation.  Wound care care was provided by RN.  1 dose of Augmentin  was provided.  Animal control close to the holidays, so reached out to the sheriff's department and they know about the address where the bite occurred.  I told the patient and her son to call the sheriff's department and animal control tomorrow to follow-up on this.  I will give her a prescription for Augmentin .  Wound care instructions discussed with the patient.  Will have them follow-up with her primary care provider as needed.   The patient may return to the emergency department for any new, worsening, or concerning symptoms. Patient was given the opportunity to ask questions; all questions were answered. Emergency department return precautions were discussed with the patient.  Patient is in agreement to the treatment plan.  Patient is stable for discharge.   FINAL CLINICAL IMPRESSION(S) / ED DIAGNOSES   Final diagnoses:  Dog bite of finger, initial encounter     Rx / DC Orders   ED Discharge Orders          Ordered    amoxicillin -clavulanate (AUGMENTIN ) 875-125 MG tablet  2 times daily        02/12/24 2108             Note:  This document was prepared using Dragon voice recognition software and may include unintentional dictation errors.     Sheron Salm, PA-C 02/12/24 2126    Waymond Lorelle Cummins, MD 02/12/24 2127  "

## 2024-02-12 NOTE — ED Triage Notes (Signed)
 Pt to ED with son for dog bite to L arm about 30 min ago from son's wife's dog. Dog current on all shots. Bites are to L upper arm and one of L fingers currently bandaged.

## 2024-02-12 NOTE — Discharge Instructions (Addendum)
 You were seen in the emergency department today for an animal bite.  Please clean your wounded area with soap and water daily then cover it with a band-aid until fully healed.  Please pick up and take the antibiotics as prescribed.  You may take Tylenol  based on the instructions on the bottle over-the-counter as needed for pain unless your doctors told you not to take medication.  Please also contact Animal Control [phone] or the [facility] tomorrow regarding your recent animal bite and let them know what happened.  Please return to the emergency department or go to an urgent care for any new, worsening, or concerning symptoms.  If you have taken antibiotics (Augmentin ) for 24 to 48 hours and noticed increased swelling, redness, puslike discharge, fever or chills or any other signs of infection, please return to the emergency department or your primary care provider immediately.
